# Patient Record
Sex: Female | Born: 1965 | Race: White | Hispanic: No | Marital: Married | Smoking: Current every day smoker
Health system: Southern US, Community
[De-identification: ages and names within clinical notes are randomized; demographics above are authoritative.]

## PROBLEM LIST (undated history)

## (undated) DIAGNOSIS — F32A Depression, unspecified: Secondary | ICD-10-CM

## (undated) DIAGNOSIS — E119 Type 2 diabetes mellitus without complications: Secondary | ICD-10-CM

## (undated) DIAGNOSIS — I219 Acute myocardial infarction, unspecified: Secondary | ICD-10-CM

## (undated) DIAGNOSIS — I509 Heart failure, unspecified: Secondary | ICD-10-CM

## (undated) HISTORY — PX: CARDIAC CATHETERIZATION: SHX172

## (undated) HISTORY — PX: OTHER SURGICAL HISTORY: SHX169

---

## 2004-06-15 ENCOUNTER — Emergency Department: Payer: Self-pay | Admitting: Emergency Medicine

## 2006-04-27 ENCOUNTER — Emergency Department: Payer: Self-pay | Admitting: Emergency Medicine

## 2009-04-01 ENCOUNTER — Emergency Department: Payer: Self-pay

## 2011-01-10 ENCOUNTER — Ambulatory Visit: Payer: Self-pay | Admitting: Orthopedic Surgery

## 2011-10-27 ENCOUNTER — Emergency Department: Payer: Self-pay | Admitting: Emergency Medicine

## 2011-10-27 LAB — COMPREHENSIVE METABOLIC PANEL
Albumin: 3.6 g/dL (ref 3.4–5.0)
Alkaline Phosphatase: 116 U/L (ref 50–136)
BUN: 7 mg/dL (ref 7–18)
Bilirubin,Total: 0.3 mg/dL (ref 0.2–1.0)
Chloride: 106 mmol/L (ref 98–107)
Creatinine: 0.49 mg/dL — ABNORMAL LOW (ref 0.60–1.30)
EGFR (African American): >60
EGFR (Non-African Amer.): >60
Osmolality: 277 (ref 275–301)
SGOT(AST): 11 U/L — ABNORMAL LOW (ref 15–37)
SGPT (ALT): 17 U/L (ref 12–78)
Sodium: 140 mmol/L (ref 136–145)
Total Protein: 7.2 g/dL (ref 6.4–8.2)

## 2011-10-27 LAB — CBC
HCT: 33.1 % — ABNORMAL LOW (ref 35.0–47.0)
MCV: 84 fL (ref 80–100)
Platelet: 355 10*3/uL (ref 150–440)
RBC: 3.93 10*6/uL (ref 3.80–5.20)
RDW: 21.1 % — ABNORMAL HIGH (ref 11.5–14.5)
WBC: 8.9 10*3/uL (ref 3.6–11.0)

## 2011-10-27 LAB — URINALYSIS, COMPLETE
Bilirubin,UR: NEGATIVE
Ketone: NEGATIVE
RBC,UR: NONE SEEN /HPF (ref 0–5)
Squamous Epithelial: <1
WBC UR: <1 /HPF (ref 0–5)

## 2011-10-27 LAB — LIPASE, BLOOD: Lipase: 308 U/L (ref 73–393)

## 2011-12-02 ENCOUNTER — Ambulatory Visit: Payer: Self-pay | Admitting: Family Medicine

## 2011-12-16 ENCOUNTER — Ambulatory Visit: Payer: Self-pay | Admitting: Family Medicine

## 2013-04-12 ENCOUNTER — Emergency Department: Payer: Self-pay | Admitting: Emergency Medicine

## 2013-04-12 LAB — CBC WITH DIFFERENTIAL/PLATELET
BASOS ABS: 0.1 10*3/uL (ref 0.0–0.1)
Basophil %: 0.9 %
Eosinophil #: 0.1 10*3/uL (ref 0.0–0.7)
Eosinophil %: 0.5 %
HCT: 37.3 % (ref 35.0–47.0)
HGB: 11.6 g/dL — ABNORMAL LOW (ref 12.0–16.0)
Lymphocyte #: 4.7 10*3/uL — ABNORMAL HIGH (ref 1.0–3.6)
Lymphocyte %: 30.6 %
MCH: 23.8 pg — ABNORMAL LOW (ref 26.0–34.0)
MCHC: 31.1 g/dL — AB (ref 32.0–36.0)
MCV: 77 fL — AB (ref 80–100)
MONOS PCT: 10.1 %
Monocyte #: 1.5 x10 3/mm — ABNORMAL HIGH (ref 0.2–0.9)
NEUTROS ABS: 8.9 10*3/uL — AB (ref 1.4–6.5)
NEUTROS PCT: 57.9 %
PLATELETS: 427 10*3/uL (ref 150–440)
RBC: 4.85 10*6/uL (ref 3.80–5.20)
RDW: 19.8 % — ABNORMAL HIGH (ref 11.5–14.5)
WBC: 15.3 10*3/uL — ABNORMAL HIGH (ref 3.6–11.0)

## 2013-04-12 LAB — URINALYSIS, COMPLETE
BLOOD: NEGATIVE
Bacteria: NONE SEEN
Bilirubin,UR: NEGATIVE
Glucose,UR: NEGATIVE mg/dL (ref 0–75)
Ketone: NEGATIVE
LEUKOCYTE ESTERASE: NEGATIVE
NITRITE: NEGATIVE
PH: 5 (ref 4.5–8.0)
PROTEIN: NEGATIVE
RBC,UR: NONE SEEN /HPF (ref 0–5)
Specific Gravity: 1.005 (ref 1.003–1.030)
Squamous Epithelial: 1

## 2013-04-12 LAB — COMPREHENSIVE METABOLIC PANEL
ANION GAP: 8 (ref 7–16)
Albumin: 4 g/dL (ref 3.4–5.0)
Alkaline Phosphatase: 150 U/L — ABNORMAL HIGH
BUN: 12 mg/dL (ref 7–18)
Bilirubin,Total: 0.6 mg/dL (ref 0.2–1.0)
CALCIUM: 9.4 mg/dL (ref 8.5–10.1)
CHLORIDE: 100 mmol/L (ref 98–107)
Co2: 24 mmol/L (ref 21–32)
Creatinine: 1.13 mg/dL (ref 0.60–1.30)
EGFR (African American): >60
EGFR (Non-African Amer.): 57 — ABNORMAL LOW
Glucose: 149 mg/dL — ABNORMAL HIGH (ref 65–99)
OSMOLALITY: 267 (ref 275–301)
Potassium: 3.7 mmol/L (ref 3.5–5.1)
SGOT(AST): 20 U/L (ref 15–37)
SGPT (ALT): 21 U/L (ref 12–78)
Sodium: 132 mmol/L — ABNORMAL LOW (ref 136–145)
TOTAL PROTEIN: 7.9 g/dL (ref 6.4–8.2)

## 2013-04-12 LAB — LIPASE, BLOOD: Lipase: 216 U/L (ref 73–393)

## 2013-09-24 ENCOUNTER — Emergency Department: Payer: Self-pay | Admitting: Emergency Medicine

## 2013-09-24 LAB — ACETAMINOPHEN LEVEL

## 2013-09-24 LAB — COMPREHENSIVE METABOLIC PANEL
ALBUMIN: 3.3 g/dL — AB (ref 3.4–5.0)
ALT: 18 U/L
ANION GAP: 7 (ref 7–16)
Alkaline Phosphatase: 134 U/L — ABNORMAL HIGH
BILIRUBIN TOTAL: 0.2 mg/dL (ref 0.2–1.0)
BUN: 7 mg/dL (ref 7–18)
CALCIUM: 8.6 mg/dL (ref 8.5–10.1)
CREATININE: 0.57 mg/dL — AB (ref 0.60–1.30)
Chloride: 107 mmol/L (ref 98–107)
Co2: 24 mmol/L (ref 21–32)
EGFR (Non-African Amer.): >60
Glucose: 159 mg/dL — ABNORMAL HIGH (ref 65–99)
Osmolality: 277 (ref 275–301)
Potassium: 3.8 mmol/L (ref 3.5–5.1)
SGOT(AST): 20 U/L (ref 15–37)
Sodium: 138 mmol/L (ref 136–145)
TOTAL PROTEIN: 7.5 g/dL (ref 6.4–8.2)

## 2013-09-24 LAB — DRUG SCREEN, URINE
AMPHETAMINES, UR SCREEN: NEGATIVE (ref ?–1000)
BARBITURATES, UR SCREEN: NEGATIVE (ref ?–200)
Benzodiazepine, Ur Scrn: POSITIVE (ref ?–200)
COCAINE METABOLITE, UR ~~LOC~~: NEGATIVE (ref ?–300)
Cannabinoid 50 Ng, Ur ~~LOC~~: POSITIVE (ref ?–50)
MDMA (ECSTASY) UR SCREEN: NEGATIVE (ref ?–500)
Methadone, Ur Screen: NEGATIVE (ref ?–300)
Opiate, Ur Screen: NEGATIVE (ref ?–300)
Phencyclidine (PCP) Ur S: NEGATIVE (ref ?–25)
TRICYCLIC, UR SCREEN: NEGATIVE (ref ?–1000)

## 2013-09-24 LAB — URINALYSIS, COMPLETE
Bacteria: NONE SEEN
Bilirubin,UR: NEGATIVE
Glucose,UR: 150 mg/dL (ref 0–75)
Ketone: NEGATIVE
Leukocyte Esterase: NEGATIVE
Nitrite: NEGATIVE
Ph: 6 (ref 4.5–8.0)
Protein: NEGATIVE
Specific Gravity: 1.002 (ref 1.003–1.030)
Squamous Epithelial: 2

## 2013-09-24 LAB — CBC
HCT: 34.1 % — ABNORMAL LOW (ref 35.0–47.0)
HGB: 10.3 g/dL — ABNORMAL LOW (ref 12.0–16.0)
MCH: 22.9 pg — ABNORMAL LOW (ref 26.0–34.0)
MCHC: 30.2 g/dL — ABNORMAL LOW (ref 32.0–36.0)
MCV: 76 fL — AB (ref 80–100)
Platelet: 332 10*3/uL (ref 150–440)
RBC: 4.49 10*6/uL (ref 3.80–5.20)
RDW: 21.4 % — ABNORMAL HIGH (ref 11.5–14.5)
WBC: 11.1 10*3/uL — AB (ref 3.6–11.0)

## 2013-09-24 LAB — TSH: THYROID STIMULATING HORM: 1.79 u[IU]/mL

## 2013-09-24 LAB — ETHANOL: Ethanol: <3 mg/dL

## 2013-09-24 LAB — SALICYLATE LEVEL: Salicylates, Serum: 2.6 mg/dL

## 2014-04-29 NOTE — Consult Note (Signed)
PATIENT NAME:  Tina Cisneros, Tina Cisneros MR#:  [id number] DATE OF BIRTH:  02-Sep-1965  DATE OF CONSULTATION:  09/25/2013  REFERRING PHYSICIAN:   CONSULTING PHYSICIAN:  Kaiyden Simkin K. Mina Babula, MD  SUBJECTIVE:  The patient was seen in consultation in the [facility] Emergency Room, [facility].  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  The patient is married for 20 years and lives with her husband, who is a Nutritional therapistplumber.  The patient and husband live along with their disabled daughter who is 8018 year's old and has mild mental disabilities and a 51 year old daughter who could not get along with her husband and moved back with them along with their 51-year-old child.  All five of them live together.  The patient was brought to [facility] after she had taken too many Xanax pills, and she fell down.  According to information obtained from the chart, the patient presented to the Emergency Room due to taking an unknown amount of Xanax.  She fell and hit her head on a doorknob at her home and was brought to the Emergency Room to get checked out.  According to Emergency Room reports, the patient was voicing suicidal ideas, and she told her family that they would be better off without her.  She again stated that she was not suicidal and what she said about her family being better off without her was a misunderstanding.   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  She had an appointment with [facility], and they are going to place her in a group home and she filled out all the papers.  She bought Xanax pills, a couple of them from the streets, and she took them and then she hit her head on the doorknob.  After she came to the Emergency Room, multiple stitches  were put in her left eyebrow.  PAST PSYCHIATRIC HISTORY:  No previous history of inpatient on psychiatry.  No history of suicidal attempts.  Not been followed by any psychiatrist except that she took her daughter to [facility] for help.  ALCOHOL AND DRUGS:  Denied drinking alcohol.   Denies street or prescription drug abuse.  Denies smoking nicotine cigarettes.   MENTAL STATUS: The patient is dressed in hospital clothes, alert and oriented to place, person, and time.  Pleasant and cooperative.  Appears really stressed out as stated above  when her husband was waiting in the Emergency Room [facility], he had a myocardial infarction and was taken to [facility] and he is an inpatient there now.  Admits feeling stressed out.  She realizes that her family needs her really badly.  No psychosis.  Does not appear to be responding to internal stimuli.  Cognition intact.  General information fair.  Memory is intact.  Could recall all the memory.  Denies any suicidal or homicidal plans.  Admits that she needs to live to take care of her family and is eager to go home to help her family but does feel stressed out and she realizes that she needs help with the same.  Insight and judgment fair.  Impulse control is fair and improved.    IMPRESSION: Acute stress disorder, major depression - moderate.  PLAN:  Discontinue involuntary commitment.  Discharge the patient home and she will go to [facility] for help along with her daughter, and she realizes that she needs to be involved in group and individual therapy to learn better coping skills and not decompensate and get Xanax off the streets.     ____________________________ Jannet Mantis. Guss Bunde, MD skc:nr D: 09/25/2013 11:43:12 ET T: 09/25/2013 12:51:34 ET JOB#: [id number]  cc: Monika Salk K. Guss Bunde, MD, <Dictator> Beau Fanny MD ELECTRONICALLY SIGNED 10/01/2013 10:50

## 2014-10-06 ENCOUNTER — Encounter: Payer: Self-pay | Admitting: Emergency Medicine

## 2014-10-06 ENCOUNTER — Emergency Department
Admission: EM | Admit: 2014-10-06 | Discharge: 2014-10-06 | Disposition: A | Discharge status: Home / Self Care | Payer: Self-pay | Attending: Emergency Medicine | Admitting: Emergency Medicine

## 2014-10-06 ENCOUNTER — Other Ambulatory Visit: Payer: Self-pay | Admitting: Nurse Practitioner

## 2014-10-06 DIAGNOSIS — Y9389 Activity, other specified: Secondary | ICD-10-CM | POA: Insufficient documentation

## 2014-10-06 DIAGNOSIS — S4990XA Unspecified injury of shoulder and upper arm, unspecified arm, initial encounter: Secondary | ICD-10-CM | POA: Insufficient documentation

## 2014-10-06 DIAGNOSIS — Z720 Tobacco use: Secondary | ICD-10-CM | POA: Insufficient documentation

## 2014-10-06 DIAGNOSIS — M7918 Myalgia, other site: Secondary | ICD-10-CM

## 2014-10-06 DIAGNOSIS — S199XXA Unspecified injury of neck, initial encounter: Secondary | ICD-10-CM | POA: Insufficient documentation

## 2014-10-06 DIAGNOSIS — Y998 Other external cause status: Secondary | ICD-10-CM | POA: Insufficient documentation

## 2014-10-06 DIAGNOSIS — S3992XA Unspecified injury of lower back, initial encounter: Secondary | ICD-10-CM | POA: Insufficient documentation

## 2014-10-06 DIAGNOSIS — Z1231 Encounter for screening mammogram for malignant neoplasm of breast: Secondary | ICD-10-CM

## 2014-10-06 DIAGNOSIS — Y92410 Unspecified street and highway as the place of occurrence of the external cause: Secondary | ICD-10-CM | POA: Insufficient documentation

## 2014-10-06 DIAGNOSIS — S299XXA Unspecified injury of thorax, initial encounter: Secondary | ICD-10-CM | POA: Insufficient documentation

## 2014-10-06 MED ORDER — DIAZEPAM 5 MG/ML IJ SOLN
5.0000 mg | Freq: Once | INTRAMUSCULAR | Status: AC
  Administered 2014-10-06: 5 mg via INTRAVENOUS
  Filled 2014-10-06: qty 2

## 2014-10-06 MED ORDER — KETOROLAC TROMETHAMINE 30 MG/ML IJ SOLN
30.0000 mg | Freq: Once | INTRAMUSCULAR | Status: AC
  Administered 2014-10-06: 30 mg via INTRAVENOUS
  Filled 2014-10-06: qty 1

## 2014-10-06 MED ORDER — IBUPROFEN 800 MG PO TABS: 800.0000 mg | ORAL_TABLET | Freq: Three times a day (TID) | ORAL | Status: DC | PRN

## 2014-10-06 MED ORDER — OXYCODONE-ACETAMINOPHEN 5-325 MG PO TABS: 1.0000 | ORAL_TABLET | ORAL | Status: DC | PRN

## 2014-10-06 MED ORDER — CYCLOBENZAPRINE HCL 10 MG PO TABS: 10.0000 mg | ORAL_TABLET | Freq: Three times a day (TID) | ORAL | Status: DC | PRN
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
## 2014-10-06 NOTE — ED Notes (Signed)
Pt in [facility] yesterday and now having back pain and muscle spasms.

## 2014-10-19 ENCOUNTER — Ambulatory Visit
Admission: RE | Admit: 2014-10-19 | Discharge: 2014-10-19 | Disposition: A | Discharge status: Home / Self Care | Payer: No Typology Code available for payment source | Source: Ambulatory Visit | Attending: Nurse Practitioner | Admitting: Nurse Practitioner

## 2014-10-19 DIAGNOSIS — Z1231 Encounter for screening mammogram for malignant neoplasm of breast: Secondary | ICD-10-CM

## 2015-01-18 ENCOUNTER — Emergency Department
Admission: EM | Admit: 2015-01-18 | Discharge: 2015-01-18 | Disposition: A | Discharge status: Home / Self Care | Payer: Self-pay | Attending: Emergency Medicine | Admitting: Emergency Medicine

## 2015-01-18 DIAGNOSIS — M62830 Muscle spasm of back: Secondary | ICD-10-CM | POA: Insufficient documentation

## 2015-01-18 DIAGNOSIS — W010XXA Fall on same level from slipping, tripping and stumbling without subsequent striking against object, initial encounter: Secondary | ICD-10-CM | POA: Insufficient documentation

## 2015-01-18 DIAGNOSIS — Y998 Other external cause status: Secondary | ICD-10-CM | POA: Insufficient documentation

## 2015-01-18 DIAGNOSIS — Y9389 Activity, other specified: Secondary | ICD-10-CM | POA: Insufficient documentation

## 2015-01-18 DIAGNOSIS — W19XXXA Unspecified fall, initial encounter: Secondary | ICD-10-CM

## 2015-01-18 DIAGNOSIS — Y9289 Other specified places as the place of occurrence of the external cause: Secondary | ICD-10-CM | POA: Insufficient documentation

## 2015-01-18 DIAGNOSIS — S40011A Contusion of right shoulder, initial encounter: Secondary | ICD-10-CM | POA: Insufficient documentation

## 2015-01-18 DIAGNOSIS — S3992XA Unspecified injury of lower back, initial encounter: Secondary | ICD-10-CM | POA: Insufficient documentation

## 2015-01-18 DIAGNOSIS — S93401A Sprain of unspecified ligament of right ankle, initial encounter: Secondary | ICD-10-CM | POA: Insufficient documentation

## 2015-01-18 DIAGNOSIS — F17200 Nicotine dependence, unspecified, uncomplicated: Secondary | ICD-10-CM | POA: Insufficient documentation

## 2015-01-18 DIAGNOSIS — S5001XA Contusion of right elbow, initial encounter: Secondary | ICD-10-CM | POA: Insufficient documentation

## 2015-01-18 MED ORDER — MELOXICAM 15 MG PO TABS: 15.0000 mg | ORAL_TABLET | Freq: Every day | ORAL | Status: DC

## 2015-01-18 MED ORDER — OXYCODONE-ACETAMINOPHEN 5-325 MG PO TABS
1.0000 | ORAL_TABLET | Freq: Once | ORAL | Status: AC
  Administered 2015-01-18: 1 via ORAL
  Filled 2015-01-18: qty 1

## 2015-01-18 MED ORDER — HYDROCODONE-ACETAMINOPHEN 5-325 MG PO TABS: 1.0000 | ORAL_TABLET | ORAL | Status: DC | PRN

## 2015-01-18 MED ORDER — METHOCARBAMOL 500 MG PO TABS: 500.0000 mg | ORAL_TABLET | Freq: Four times a day (QID) | ORAL | Status: DC

## 2015-01-18 NOTE — ED Provider Notes (Signed)
[facility] Emergency Department Provider Note  ____________________________________________  Time seen: Approximately 2:24 PM  I have reviewed the triage vital signs and the nursing notes.   HISTORY  Chief Complaint Fall    HPI Tina Cisneros is a 50 y.o. female who presents emergency department status post a fall last night. She states that she was on her front porch when she accidentally stepped off the side of the porch landing on her right ankle, right elbow, right shoulder, and twisting her back. She tried to go to work this morning states that area started taking so she percent emergency department. Patient is ambulatory on affected ankle. She has full range of motion to shoulder and elbow on right extremity. She denies any numbness or tingling or saddle anesthesia. Patient did not hit her head or lose consciousness.   History reviewed. No pertinent past medical history.  There are no active problems to display for this patient.   History reviewed. No pertinent past surgical history.  Current Outpatient Rx  Name  Route  Sig  Dispense  Refill  . cyclobenzaprine (FLEXERIL) 10 MG tablet   Oral   Take 1 tablet (10 mg total) by mouth every 8 (eight) hours as needed for muscle spasms.   30 tablet   1   . HYDROcodone-acetaminophen (NORCO/VICODIN) 5-325 MG tablet   Oral   Take 1 tablet by mouth every 4 (four) hours as needed for moderate pain.   20 tablet   0   . ibuprofen (ADVIL,MOTRIN) 800 MG tablet   Oral   Take 1 tablet (800 mg total) by mouth every 8 (eight) hours as needed.   30 tablet   0   . meloxicam (MOBIC) 15 MG tablet   Oral   Take 1 tablet (15 mg total) by mouth daily.   30 tablet   0   . methocarbamol (ROBAXIN) 500 MG tablet   Oral   Take 1 tablet (500 mg total) by mouth 4 (four) times daily.   16 tablet   0   . oxyCODONE-acetaminophen (ROXICET) 5-325 MG tablet   Oral   Take 1-2 tablets by mouth every 4 (four) hours as  needed for severe pain.   15 tablet   0     Allergies Review of patient's allergies indicates no known allergies.  Family History  Problem Relation Age of Onset  . Breast cancer Sister     Social History Social History  Substance Use Topics  . Smoking status: Current Some Day Smoker  . Smokeless tobacco: None  . Alcohol Use: No     Review of Systems  Constitutional: No fever/chills Eyes: No visual changes. No discharge ENT: No sore throat. Cardiovascular: no chest pain. Respiratory: no cough. No SOB. Gastrointestinal: No abdominal pain.  No nausea, no vomiting.  No diarrhea.  No constipation. Genitourinary: Negative for dysuria. No hematuria Musculoskeletal: Positive for back pain. Positive for right shoulder, right elbow, right ankle pain. Skin: Negative for rash. Neurological: Negative for headaches, focal weakness or numbness. 10-point ROS otherwise negative.  ____________________________________________   PHYSICAL EXAM:  VITAL SIGNS: ED Triage Vitals  Enc Vitals Group     BP 01/18/15 1202 132/72 mmHg     Pulse Rate 01/18/15 1202 98     Resp 01/18/15 1202 18     Temp 01/18/15 1202 98.3 F (36.8 C)     Temp Source 01/18/15 1202 Oral     SpO2 01/18/15 1202 99 %     Weight  01/18/15 1202 152 lb (68.947 kg)     Height 01/18/15 1202 5\' 1"  (1.549 m)     Head Cir --      Peak Flow --      Pain Score 01/18/15 1203 7     Pain Loc --      Pain Edu? --      Excl. in GC? --      Constitutional: Alert and oriented. Well appearing and in no acute distress. Eyes: Conjunctivae are normal. PERRL. EOMI. Head: Atraumatic. ENT:      Ears:       Nose: No congestion/rhinnorhea.      Mouth/Throat: Mucous membranes are moist.  Neck: No stridor.  No cervical spine tenderness to palpation. Hematological/Lymphatic/Immunilogical: No cervical lymphadenopathy. Cardiovascular: Normal rate, regular rhythm. Normal S1 and S2.  Good peripheral circulation. Respiratory: Normal  respiratory effort without tachypnea or retractions. Lungs CTAB. Gastrointestinal: Soft and nontender. No distention. No CVA tenderness. Musculoskeletal: No lower extremity tenderness nor edema.  No joint effusions. No visible deformity to spine upon inspection. Patient is nontender to palpation. Midline. Patient is diffusely tender to palpation over the right-sided paraspinal muscle groups. Minor spasms are noted to palpation. No palpable abnormality. No visible abnormality to right shoulder or right elbow. Inspection. Full range of motion to both. No point tenderness to palpation. No palpable abnormality. Radial pulse and sensation are intact distally. Minor edema noted to the lateral malleolus of the right ankle compared to left. No visible deformity. Full range of motion to ankle. Patient is mildly tender to palpation along the talonavicular joint line. No tenderness to palpation over the tarsal or metatarsal bones. Dorsalis pedis pulses intact. Sensation is intact all 5 digits and equal without affected extremity. Neurologic:  Normal speech and language. No gross focal neurologic deficits are appreciated.  Skin:  Skin is warm, dry and intact. No rash noted. Psychiatric: Mood and affect are normal. Speech and behavior are normal. Patient exhibits appropriate insight and judgement.   ____________________________________________   LABS (all labs ordered are listed, but only abnormal results are displayed)  Labs Reviewed - No data to display ____________________________________________  EKG   ____________________________________________  RADIOLOGY   No results found.  ____________________________________________    PROCEDURES  Procedure(s) performed:       Medications  oxyCODONE-acetaminophen (PERCOCET/ROXICET) 5-325 MG per tablet 1 tablet (1 tablet Oral Given 01/18/15 1411)     ____________________________________________   INITIAL IMPRESSION / ASSESSMENT AND PLAN / ED  COURSE  Pertinent labs & imaging results that were available during my care of the patient were reviewed by me and considered in my medical decision making (see chart for details).  Patient's diagnosis is consistent with a fall causing contusions to the right shoulder, right elbow, and it right ankle sprain. Patient also has paraspinal muscle spasms and the right lumbar region. Patient will be discharged home with prescriptions for anti-inflammatories, muscle relaxers, pain medication. Patient is to follow up with primary care if symptoms persist past this treatment course. Patient is given ED precautions to return to the ED for any worsening or new symptoms.     ____________________________________________  FINAL CLINICAL IMPRESSION(S) / ED DIAGNOSES  Final diagnoses:  Fall, initial encounter  Shoulder contusion, right, initial encounter  Elbow contusion, right, initial encounter  Lumbar paraspinal muscle spasm  Ankle sprain, right, initial encounter      NEW MEDICATIONS STARTED DURING THIS VISIT:  New Prescriptions   HYDROCODONE-ACETAMINOPHEN (NORCO/VICODIN) 5-325 MG TABLET    Take  1 tablet by mouth every 4 (four) hours as needed for moderate pain.   MELOXICAM (MOBIC) 15 MG TABLET    Take 1 tablet (15 mg total) by mouth daily.   METHOCARBAMOL (ROBAXIN) 500 MG TABLET    Take 1 tablet (500 mg total) by mouth 4 (four) times daily.        Delorise Royals Allean Montfort, PA-C 01/18/15 1510  Sharman Cheek, MD 01/18/15 1537

## 2015-01-18 NOTE — ED Notes (Signed)
Assess per PA 

## 2015-01-18 NOTE — Discharge Instructions (Signed)
Ankle Sprain °An ankle sprain is an injury to the strong, fibrous tissues (ligaments) that hold the bones of your ankle joint together.  °CAUSES °An ankle sprain is usually caused by a fall or by twisting your ankle. Ankle sprains most commonly occur when you step on the outer edge of your foot, and your ankle turns inward. People who participate in sports are more prone to these types of injuries.  °SYMPTOMS  °· Pain in your ankle. The pain may be present at rest or only when you are trying to stand or walk. °· Swelling. °· Bruising. Bruising may develop immediately or within 1 to 2 days after your injury. °· Difficulty standing or walking, particularly when turning corners or changing directions. °DIAGNOSIS  °Your caregiver will ask you details about your injury and perform a physical exam of your ankle to determine if you have an ankle sprain. During the physical exam, your caregiver will press on and apply pressure to specific areas of your foot and ankle. Your caregiver will try to move your ankle in certain ways. An X-ray exam may be done to be sure a bone was not broken or a ligament did not separate from one of the bones in your ankle (avulsion fracture).  °TREATMENT  °Certain types of braces can help stabilize your ankle. Your caregiver can make a recommendation for this. Your caregiver may recommend the use of medicine for pain. If your sprain is severe, your caregiver may refer you to a surgeon who helps to restore function to parts of your skeletal system (orthopedist) or a physical therapist. °HOME CARE INSTRUCTIONS  °· Apply ice to your injury for 1-2 days or as directed by your caregiver. Applying ice helps to reduce inflammation and pain. °· Put ice in a plastic bag. °· Place a towel between your skin and the bag. °· Leave the ice on for 15-20 minutes at a time, every 2 hours while you are awake. °· Only take over-the-counter or prescription medicines for pain, discomfort, or fever as directed by  your caregiver. °· Elevate your injured ankle above the level of your heart as much as possible for 2-3 days. °· If your caregiver recommends crutches, use them as instructed. Gradually put weight on the affected ankle. Continue to use crutches or a cane until you can walk without feeling pain in your ankle. °· If you have a plaster splint, wear the splint as directed by your caregiver. Do not rest it on anything harder than a pillow for the first 24 hours. Do not put weight on it. Do not get it wet. You may take it off to take a shower or bath. °· You may have been given an elastic bandage to wear around your ankle to provide support. If the elastic bandage is too tight (you have numbness or tingling in your foot or your foot becomes cold and blue), adjust the bandage to make it comfortable. °· If you have an air splint, you may blow more air into it or let air out to make it more comfortable. You may take your splint off at night and before taking a shower or bath. Wiggle your toes in the splint several times per day to decrease swelling. °SEEK MEDICAL CARE IF:  °· You have rapidly increasing bruising or swelling. °· Your toes feel extremely cold or you lose feeling in your foot. °· Your pain is not relieved with medicine. °SEEK IMMEDIATE MEDICAL CARE IF: °· Your toes are numb or blue. °·   You have severe pain that is increasing. MAKE SURE YOU:   Understand these instructions.  Will watch your condition.  Will get help right away if you are not doing well or get worse.   This information is not intended to replace advice given to you by your health care provider. Make sure you discuss any questions you have with your health care provider.   Document Released: 12/23/2004 Document Revised: 01/13/2014 Document Reviewed: 01/04/2011 Elsevier Interactive Patient Education 2016 [employer]  Contusion A contusion is a deep bruise. Contusions are the result of a blunt injury to tissues and muscle fibers  under the skin. The injury causes bleeding under the skin. The skin overlying the contusion may turn blue, purple, or yellow. Minor injuries will give you a painless contusion, but more severe contusions may stay painful and swollen for a few weeks.  CAUSES  This condition is usually caused by a blow, trauma, or direct force to an area of the body. SYMPTOMS  Symptoms of this condition include:  Swelling of the injured area.  Pain and tenderness in the injured area.  Discoloration. The area may have redness and then turn blue, purple, or yellow. DIAGNOSIS  This condition is diagnosed based on a physical exam and medical history. An X-ray, CT scan, or MRI may be needed to determine if there are any associated injuries, such as broken bones (fractures). TREATMENT  Specific treatment for this condition depends on what area of the body was injured. In general, the best treatment for a contusion is resting, icing, applying pressure to (compression), and elevating the injured area. This is often called the RICE strategy. Over-the-counter anti-inflammatory medicines may also be recommended for pain control.  HOME CARE INSTRUCTIONS   Rest the injured area.  If directed, apply ice to the injured area:  Put ice in a plastic bag.  Place a towel between your skin and the bag.  Leave the ice on for 20 minutes, 2-3 times per day.  If directed, apply light compression to the injured area using an elastic bandage. Make sure the bandage is not wrapped too tightly. Remove and reapply the bandage as directed by your health care provider.  If possible, raise (elevate) the injured area above the level of your heart while you are sitting or lying down.  Take over-the-counter and prescription medicines only as told by your health care provider. SEEK MEDICAL CARE IF:  Your symptoms do not improve after several days of treatment.  Your symptoms get worse.  You have difficulty moving the injured  area. SEEK IMMEDIATE MEDICAL CARE IF:   You have severe pain.  You have numbness in a hand or foot.  Your hand or foot turns pale or cold.   This information is not intended to replace advice given to you by your health care provider. Make sure you discuss any questions you have with your health care provider.   Document Released: 10/02/2004 Document Revised: 09/13/2014 Document Reviewed: 05/10/2014 Elsevier Interactive Patient Education 2016 [employer]  Cryotherapy Cryotherapy means treatment with cold. Ice or gel packs can be used to reduce both pain and swelling. Ice is the most helpful within the first 24 to 48 hours after an injury or flare-up from overusing a muscle or joint. Sprains, strains, spasms, burning pain, shooting pain, and aches can all be eased with ice. Ice can also be used when recovering from surgery. Ice is effective, has very few side effects, and is safe for most people to  use. PRECAUTIONS  Ice is not a safe treatment option for people with:  Raynaud phenomenon. This is a condition affecting small blood vessels in the extremities. Exposure to cold may cause your problems to return.  Cold hypersensitivity. There are many forms of cold hypersensitivity, including:  Cold urticaria. Red, itchy hives appear on the skin when the tissues begin to warm after being iced.  Cold erythema. This is a red, itchy rash caused by exposure to cold.  Cold hemoglobinuria. Red blood cells break down when the tissues begin to warm after being iced. The hemoglobin that carry oxygen are passed into the urine because they cannot combine with blood proteins fast enough.  Numbness or altered sensitivity in the area being iced. If you have any of the following conditions, do not use ice until you have discussed cryotherapy with your caregiver:  Heart conditions, such as arrhythmia, angina, or chronic heart disease.  High blood pressure.  Healing wounds or open skin in the area  being iced.  Current infections.  Rheumatoid arthritis.  Poor circulation.  Diabetes. Ice slows the blood flow in the region it is applied. This is beneficial when trying to stop inflamed tissues from spreading irritating chemicals to surrounding tissues. However, if you expose your skin to cold temperatures for too long or without the proper protection, you can damage your skin or nerves. Watch for signs of skin damage due to cold. HOME CARE INSTRUCTIONS Follow these tips to use ice and cold packs safely.  Place a dry or damp towel between the ice and skin. A damp towel will cool the skin more quickly, so you may need to shorten the time that the ice is used.  For a more rapid response, add gentle compression to the ice.  Ice for no more than 10 to 20 minutes at a time. The bonier the area you are icing, the less time it will take to get the benefits of ice.  Check your skin after 5 minutes to make sure there are no signs of a poor response to cold or skin damage.  Rest 20 minutes or more between uses.  Once your skin is numb, you can end your treatment. You can test numbness by very lightly touching your skin. The touch should be so light that you do not see the skin dimple from the pressure of your fingertip. When using ice, most people will feel these normal sensations in this order: cold, burning, aching, and numbness.  Do not use ice on someone who cannot communicate their responses to pain, such as small children or people with dementia. HOW TO MAKE AN ICE PACK Ice packs are the most common way to use ice therapy. Other methods include ice massage, ice baths, and cryosprays. Muscle creams that cause a cold, tingly feeling do not offer the same benefits that ice offers and should not be used as a substitute unless recommended by your caregiver. To make an ice pack, do one of the following:  Place crushed ice or a bag of frozen vegetables in a sealable plastic bag. Squeeze out the  excess air. Place this bag inside another plastic bag. Slide the bag into a pillowcase or place a damp towel between your skin and the bag.  Mix 3 parts water with 1 part rubbing alcohol. Freeze the mixture in a sealable plastic bag. When you remove the mixture from the freezer, it will be slushy. Squeeze out the excess air. Place this bag inside another plastic bag.  Slide the bag into a pillowcase or place a damp towel between your skin and the bag. SEEK MEDICAL CARE IF:  You develop white spots on your skin. This may give the skin a blotchy (mottled) appearance.  Your skin turns blue or pale.  Your skin becomes waxy or hard.  Your swelling gets worse. MAKE SURE YOU:   Understand these instructions.  Will watch your condition.  Will get help right away if you are not doing well or get worse.   This information is not intended to replace advice given to you by your health care provider. Make sure you discuss any questions you have with your health care provider.   Stirrup Ankle Brace  Stirrup ankle braces give support and help stabilize the ankle joint. They are rigid pieces of plastic or fiberglass that go up both sides of the lower leg with the bottom of the stirrup fitting comfortably under the bottom of the instep of the foot. It can be held on with Velcro straps or an elastic wrap. Stirrup ankle braces are used to support the ankle following mild or moderate sprains or strains, or fractures after cast removal.  They can be easily removed or adjusted if there is swelling. The rigid brace shells are designed to fit the ankle comfortably and provide the needed medial/lateral stabilization. This brace can be easily worn with most athletic shoes. The brace liner is usually made of a soft, comfortable gel-like material. This gel fits the ankle well without causing uncomfortable pressure points.  IMPORTANCE OF ANKLE BRACES:  The use of ankle bracing is effective in the prevention of ankle  sprains.  In athletes, the use of ankle bracing will offer protection and prevent further sprains.  Research shows that a complete rehabilitation program needs to be included with external bracing. This includes range of motion and ankle strengthening exercises. Your caregivers will instruct you in this. If you were given the brace today for a new injury, use the following home care instructions as a guide.  HOME CARE INSTRUCTIONS  Apply ice to the sore area for 15-20 minutes, 03-04 times per day while awake for the first 2 days. Put the ice in a plastic bag and place a towel between the bag of ice and your skin. Never place the ice pack directly on your skin. Be especially careful using ice on an elbow or knee or other bony area, such as your ankle, because icing for too long may damage the nerves which are close to the surface.  Keep your leg elevated when possible to lessen swelling.  Wear your splint until you are seen for a follow-up examination. Do not put weight on it. Do not get it wet. You may take it off to take a shower or bath.  For Activity: Use crutches with non-weight bearing for 1 week. Then, you may walk on your ankle as instructed. Start gradually with weight bearing on the affected ankle.  Continue to use crutches or a cane until you can stand on your ankle without causing pain.  Wiggle your toes in the splint several times per day if you are able.  The splint is too tight if you have numbness, tingling, or if your foot becomes cold and blue. Adjust the straps or elastic bandage to make it comfortable.  Only take over-the-counter or prescription medicines for pain, discomfort, or fever as directed by your caregiver. SEEK IMMEDIATE MEDICAL CARE IF:  You have increased bruising, swelling or pain.  Your toes are blue or cold and loosening the brace or wrap does not help.  Your pain is not relieved with medicine. MAKE SURE YOU:  Understand these instructions.  Will watch your  condition.  Will get help right away if you are not doing well or get worse. This information is not intended to replace advice given to you by your health care provider. Make sure you discuss any questions you have with your health care provider.  Document Released: 10/24/2003 Document Revised: 03/17/2011 Document Reviewed: 07/25/2014  Elsevier Interactive Patient Education 2016 [employer]     Document Released: 08/19/2010 Document Revised: 01/13/2014 Document Reviewed: 08/19/2010 Elsevier Interactive Patient Education [employer]

## 2015-01-18 NOTE — ED Notes (Signed)
Fall off of front porch onto concrete on right side of body. Pt denies LOC. Pt c/o pain to right arm, right shoulder and right side of back. Pt alert and oriented X4, active, cooperative, pt in NAD. RR even and unlabored, color WNL.  Abrasion to right arm.

## 2018-03-04 ENCOUNTER — Emergency Department
Admission: EM | Admit: 2018-03-04 | Discharge: 2018-03-04 | Disposition: A | Discharge status: Home / Self Care | Payer: BLUE CROSS/BLUE SHIELD | Attending: Student in an Organized Health Care Education/Training Program | Admitting: Student in an Organized Health Care Education/Training Program

## 2018-03-04 ENCOUNTER — Encounter: Payer: Self-pay | Admitting: Emergency Medicine

## 2018-03-04 ENCOUNTER — Emergency Department: Payer: BLUE CROSS/BLUE SHIELD

## 2018-03-04 ENCOUNTER — Other Ambulatory Visit: Payer: Self-pay

## 2018-03-04 DIAGNOSIS — W108XXA Fall (on) (from) other stairs and steps, initial encounter: Secondary | ICD-10-CM | POA: Insufficient documentation

## 2018-03-04 DIAGNOSIS — F17210 Nicotine dependence, cigarettes, uncomplicated: Secondary | ICD-10-CM | POA: Insufficient documentation

## 2018-03-04 DIAGNOSIS — Y9389 Activity, other specified: Secondary | ICD-10-CM | POA: Insufficient documentation

## 2018-03-04 DIAGNOSIS — Y929 Unspecified place or not applicable: Secondary | ICD-10-CM | POA: Insufficient documentation

## 2018-03-04 DIAGNOSIS — Y999 Unspecified external cause status: Secondary | ICD-10-CM | POA: Insufficient documentation

## 2018-03-04 DIAGNOSIS — Z79899 Other long term (current) drug therapy: Secondary | ICD-10-CM | POA: Diagnosis not present

## 2018-03-04 DIAGNOSIS — S2242XA Multiple fractures of ribs, left side, initial encounter for closed fracture: Secondary | ICD-10-CM

## 2018-03-04 DIAGNOSIS — S299XXA Unspecified injury of thorax, initial encounter: Secondary | ICD-10-CM | POA: Diagnosis present

## 2018-03-04 MED ORDER — LIDOCAINE 5 % EX PTCH: 1.0000 | MEDICATED_PATCH | Freq: Two times a day (BID) | CUTANEOUS | 0 refills | Status: AC

## 2018-03-04 MED ORDER — LIDOCAINE 5 % EX PTCH
1.0000 | MEDICATED_PATCH | CUTANEOUS | Status: DC
  Administered 2018-03-04: 1 via TRANSDERMAL
  Filled 2018-03-04: qty 1

## 2018-03-04 MED ORDER — OXYCODONE-ACETAMINOPHEN 5-325 MG PO TABS
1.0000 | ORAL_TABLET | Freq: Once | ORAL | Status: AC
  Administered 2018-03-04: 1 via ORAL
  Filled 2018-03-04: qty 1

## 2018-03-04 MED ORDER — AZITHROMYCIN 250 MG PO TABS: ORAL_TABLET | ORAL | 0 refills | Status: AC

## 2018-03-04 MED ORDER — OXYCODONE-ACETAMINOPHEN 7.5-325 MG PO TABS: 1.0000 | ORAL_TABLET | Freq: Four times a day (QID) | ORAL | 0 refills | Status: DC | PRN

## 2018-03-04 NOTE — ED Provider Notes (Signed)
[facility] Emergency Department Provider Note   ____________________________________________   First MD Initiated Contact with Patient 03/04/18 1028     (approximate)  I have reviewed the triage vital signs and the nursing notes.   HISTORY  Chief Complaint Fall    HPI Tina Cisneros is a 53 y.o. female patient complain left lateral rib pain secondary to a fall 2 days ago.  Patient that she slipped and fell down 2 steps.  Patient did pain increased with deep inspirations.  Patient rates pain as a 10/10.  Patient described the pain as "sharp/achy".  No palliative measures for complaint.    History reviewed. No pertinent past medical history.  There are no active problems to display for this patient.   History reviewed. No pertinent surgical history.  Prior to Admission medications   Medication Sig Start Date End Date Taking? Authorizing Provider  gabapentin (NEURONTIN) 300 MG capsule Take 300 mg by mouth 3 (three) times daily.   Yes [provider]  lamoTRIgine (LAMICTAL) 100 MG tablet Take 100 mg by mouth daily.   Yes [provider]  lurasidone (LATUDA) 80 MG TABS tablet Take 80 mg by mouth daily with breakfast.   Yes [provider]  traZODone (DESYREL) 50 MG tablet Take 50 mg by mouth at bedtime.   Yes [provider]  azithromycin (ZITHROMAX Z-PAK) 250 MG tablet Take 2 tablets (500 mg) on  Day 1,  followed by 1 tablet (250 mg) once daily on Days 2 through 5. 03/04/18 03/09/18  Joni Reining, PA-C  HYDROcodone-acetaminophen (NORCO/VICODIN) 5-325 MG tablet Take 1 tablet by mouth every 4 (four) hours as needed for moderate pain. 01/18/15   Cuthriell, Delorise Royals, PA-C  lidocaine (LIDODERM) 5 % Place 1 patch onto the skin every 12 (twelve) hours. Remove & Discard patch within 12 hours or as directed by MD 03/04/18 03/04/19  Joni Reining, PA-C  oxyCODONE-acetaminophen (PERCOCET) 7.5-325 MG tablet Take 1 tablet by mouth  every 6 (six) hours as needed. 03/04/18   Joni Reining, PA-C    Allergies Patient has no known allergies.  Family History  Problem Relation Age of Onset  . Breast cancer Sister     Social History Social History   Tobacco Use  . Smoking status: Current Some Day Smoker  . Smokeless tobacco: Never Used  Substance Use Topics  . Alcohol use: No  . Drug use: Not on file    Review of Systems Constitutional: No fever/chills Eyes: No visual changes. ENT: No sore throat. Cardiovascular: Denies chest pain. Respiratory: Denies shortness of breath. Gastrointestinal: No abdominal pain.  No nausea, no vomiting.  No diarrhea.  No constipation. Genitourinary: Negative for dysuria. Musculoskeletal: Left lateral rib pain. Skin: Negative for rash. Neurological: Negative for headaches, focal weakness or numbness. ____________________________________________   PHYSICAL EXAM:  VITAL SIGNS: ED Triage Vitals  Enc Vitals Group     BP 03/04/18 1028 126/67     Pulse Rate 03/04/18 1028 90     Resp --      Temp 03/04/18 1028 98.4 F (36.9 C)     Temp Source 03/04/18 1028 Oral     SpO2 03/04/18 1028 99 %     Weight 03/04/18 1032 146 lb (66.2 kg)     Height 03/04/18 1032 5\' 5"  (1.651 m)     Head Circumference --      Peak Flow --      Pain Score 03/04/18 1032 10  Pain Loc --      Pain Edu? --      Excl. in GC? --    Constitutional: Alert and oriented. Well appearing and in no acute distress. Neck: No stridor.  Hematological/Lymphatic/Immunilogical: No cervical lymphadenopathy. Cardiovascular: Normal rate, regular rhythm. Grossly normal heart sounds.  Good peripheral circulation. Respiratory: Left splinting with respiratory effort.  No retractions. Lungs CTAB. Gastrointestinal: Soft and nontender. No distention. No abdominal bruits. No CVA tenderness. Genitourinary: Deferred Musculoskeletal: No lower extremity tenderness nor edema.  No joint effusions. Neurologic:  Normal speech  and language. No gross focal neurologic deficits are appreciated. No gait instability. Skin:  Skin is warm, dry and intact. No rash noted. Psychiatric: Mood and affect are normal. Speech and behavior are normal.  ____________________________________________   LABS (all labs ordered are listed, but only abnormal results are displayed)  Labs Reviewed - No data to display ____________________________________________  EKG   ____________________________________________  RADIOLOGY  ED MD interpretation:    Official radiology report(s): Dg Ribs Unilateral W/chest Left  Result Date: 03/04/2018 CLINICAL DATA:  Larey Seat 2 days ago.  Lateral rib pain. EXAM: LEFT RIBS AND CHEST - 3+ VIEW COMPARISON:  Chest radiograph 04/27/2006 FINDINGS: Displaced fractures of the left sixth and seventh ribs. Negative for pneumothorax. Patchy interstitial densities in the mid and lower left chest. Few patchy densities at the right lung base. Stable nodular density along the lateral right lung could represent a calcified granuloma. Heart size is normal. The trachea is midline. IMPRESSION: Mildly displaced fractures involving the left sixth and seventh ribs. Patchy interstitial densities in both lungs, left side greater than right. Findings may be related to atelectasis but cannot exclude infectious or inflammatory process. Negative for a pneumothorax. Electronically Signed   By: Richarda Overlie M.D.   On: 03/04/2018 11:51    ____________________________________________   PROCEDURES  Procedure(s) performed (including Critical Care):  Procedures   ____________________________________________   INITIAL IMPRESSION / ASSESSMENT AND PLAN / ED COURSE  As part of my medical decision making, I reviewed the following data within the electronic MEDICAL RECORD NUMBER     Left lateral rib pain secondary to nondisplaced fracture of the sixth and seventh vertebrae.  Discussed x-ray findings with patient.  Patient given discharge  care instructions.  Patient advised take medication as directed and follow-up PCP in 5 days.  Return to ED if condition worsens.      ____________________________________________   FINAL CLINICAL IMPRESSION(S) / ED DIAGNOSES  Final diagnoses:  Closed fracture of multiple ribs of left side, initial encounter     ED Discharge Orders         Ordered    oxyCODONE-acetaminophen (PERCOCET) 7.5-325 MG tablet  Every 6 hours PRN     03/04/18 1202    lidocaine (LIDODERM) 5 %  Every 12 hours     03/04/18 1202    azithromycin (ZITHROMAX Z-PAK) 250 MG tablet     03/04/18 1202           Note:  This document was prepared using Dragon voice recognition software and may include unintentional dictation errors.    Joni Reining, PA-C 03/04/18 1204    Willy Eddy, MD 03/04/18 1520

## 2018-03-04 NOTE — ED Triage Notes (Signed)
Presents s/p fall  States she fell down steps 2 days ago landed on left side  Having increased pain to left rib area

## 2018-03-04 NOTE — ED Notes (Signed)
Pt discharged home after verbalizing understanding of discharge instructions; nad noted. 

## 2020-09-01 IMAGING — CR DG RIBS W/ CHEST 3+V*L*
1 series · 3 of 3 positions shown · non-contrast
Comparison: Chest radiograph 04/27/2006

CLINICAL DATA: Fell 2 days ago.  Lateral rib pain.

EXAM:
LEFT RIBS AND CHEST - 3+ VIEW

[Series 1: w chest pa · 0.14mm/px · 3 of 3 slices shown]
[im 1/3]
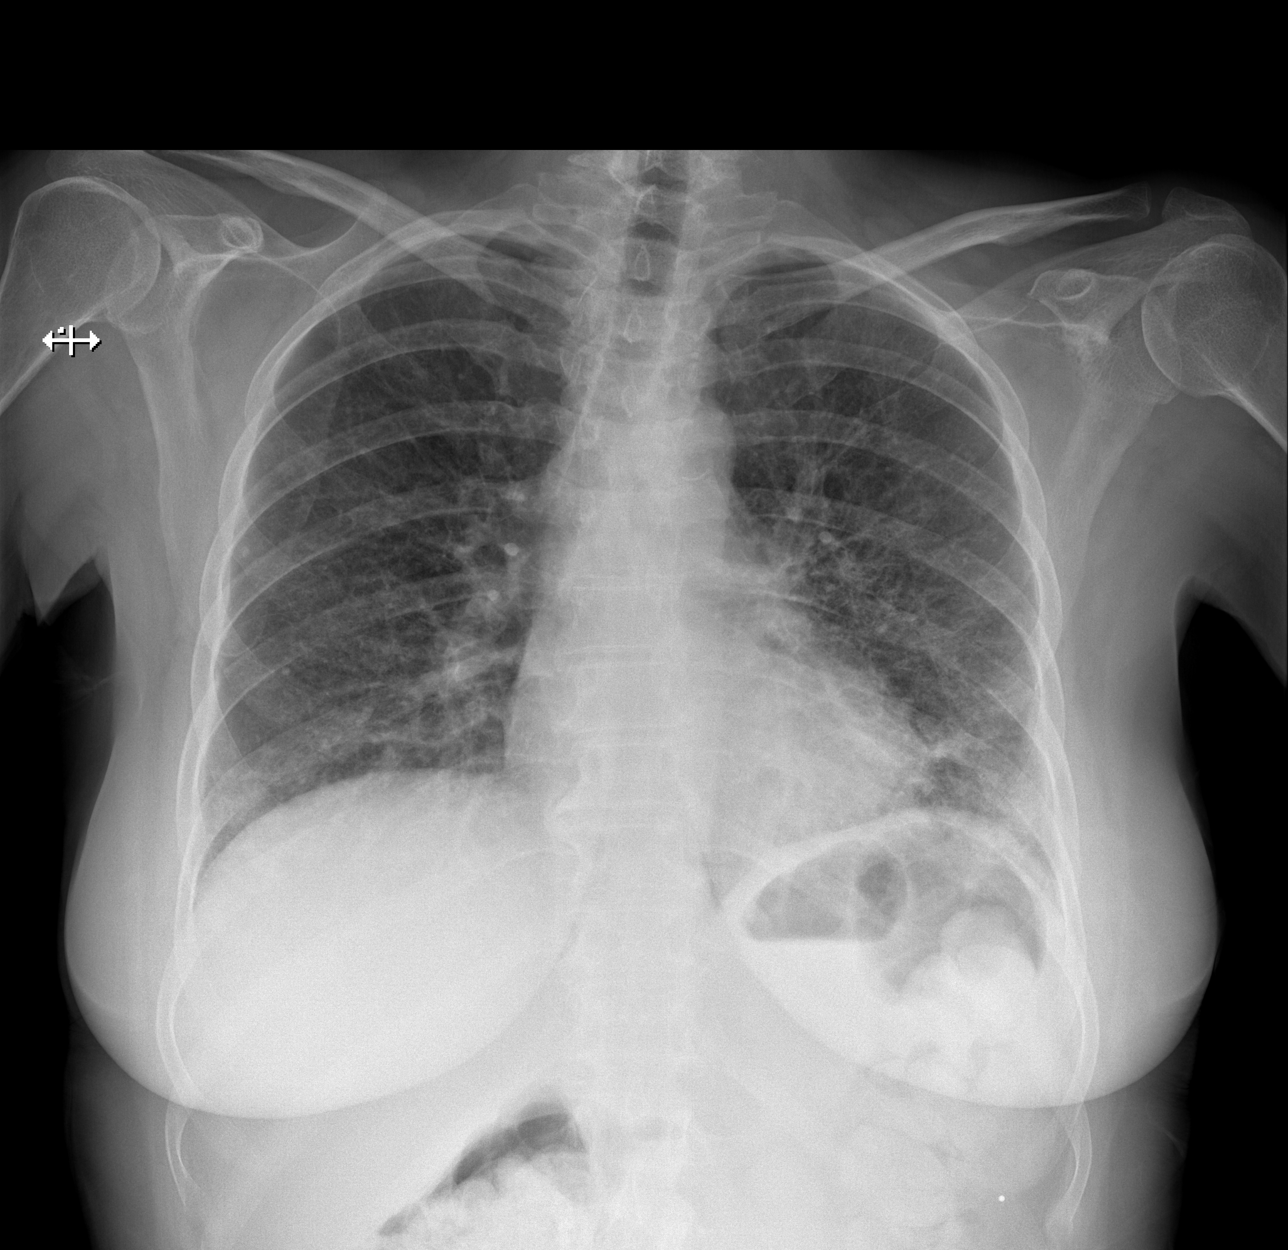
[im 2/3]
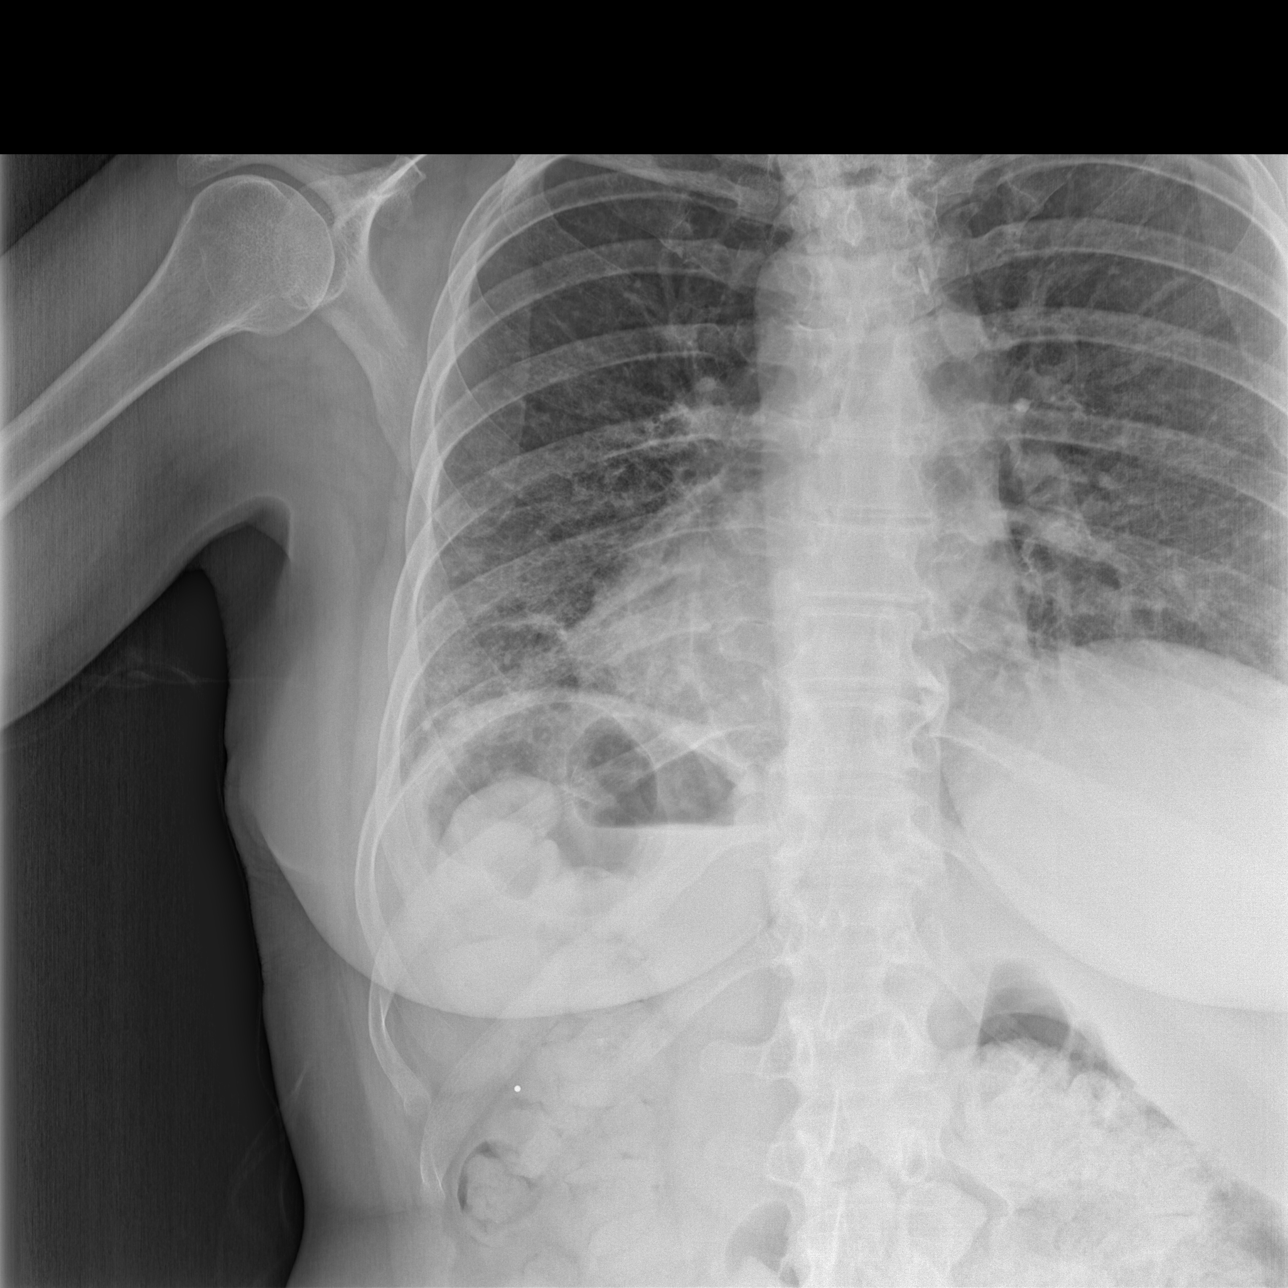
[im 3/3]
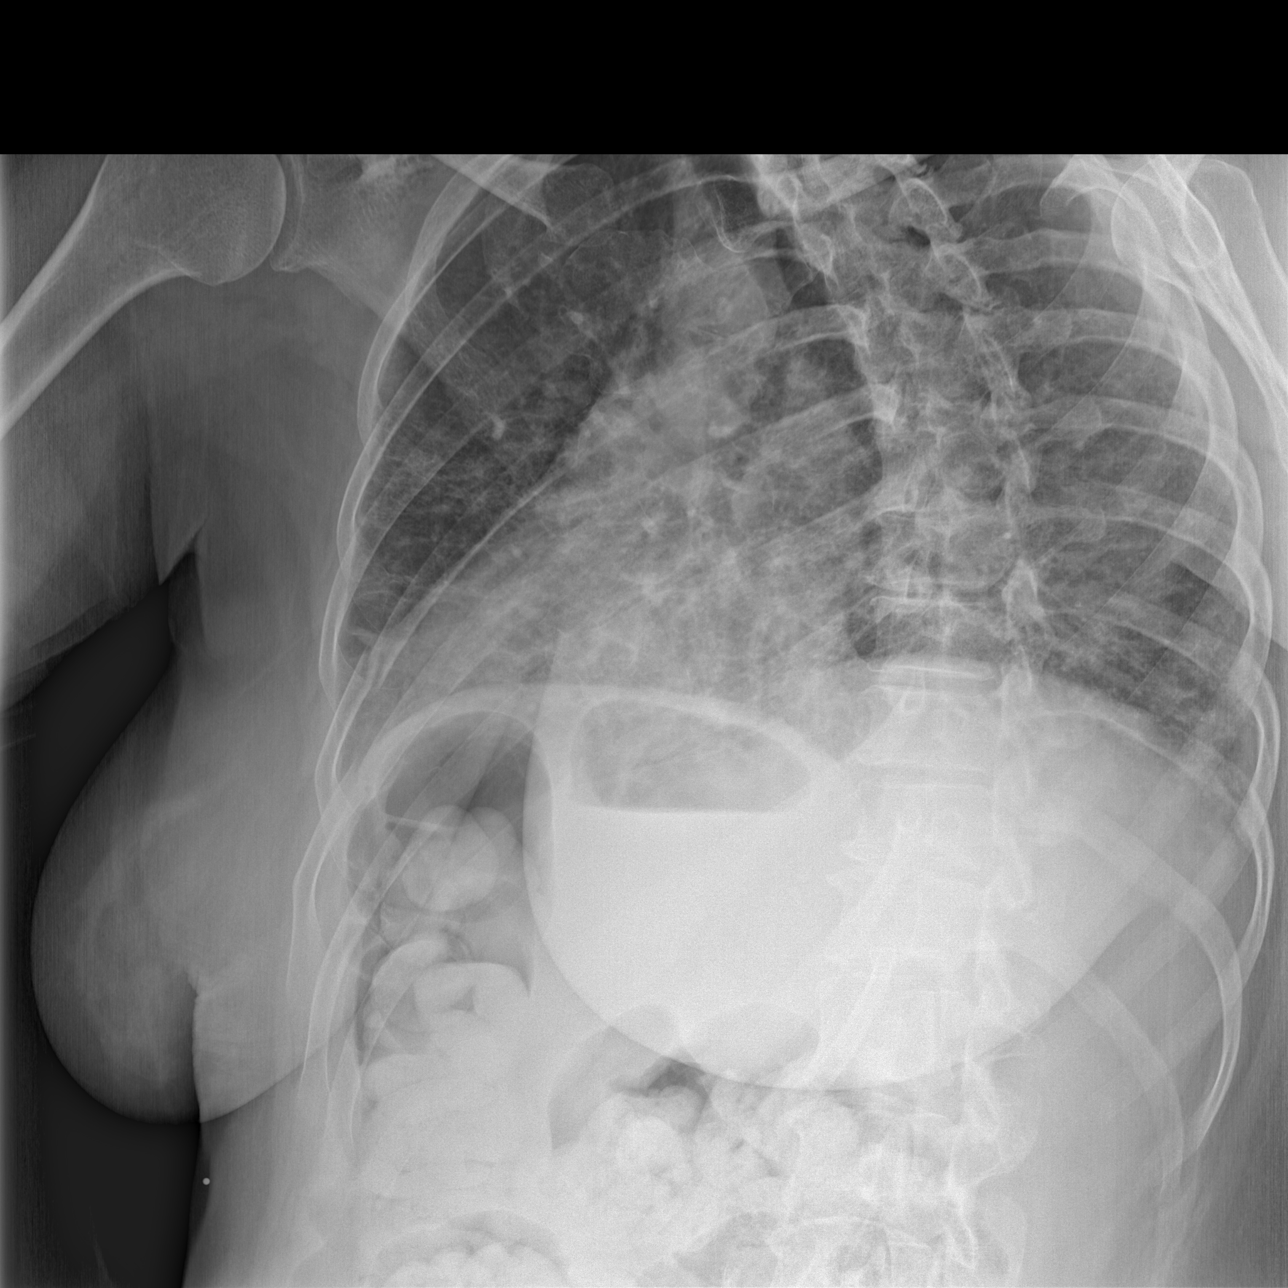

[3 of 3 positions shown; findings below may reference images not displayed]

FINDINGS: Displaced fractures of the left sixth and seventh ribs. Negative for
pneumothorax. Patchy interstitial densities in the mid and lower
left chest. Few patchy densities at the right lung base. Stable
nodular density along the lateral right lung could represent a
calcified granuloma. Heart size is normal. The trachea is midline.
IMPRESSION: Mildly displaced fractures involving the left sixth and seventh
ribs.

Patchy interstitial densities in both lungs, left side greater than
right. Findings may be related to atelectasis but cannot exclude
infectious or inflammatory process.

Negative for a pneumothorax.

## 2020-11-25 ENCOUNTER — Inpatient Hospital Stay: Payer: Self-pay | Admitting: Anesthesiology

## 2020-11-25 ENCOUNTER — Emergency Department: Payer: Self-pay

## 2020-11-25 ENCOUNTER — Inpatient Hospital Stay
Admission: EM | Admit: 2020-11-25 | Discharge: 2020-11-27 | DRG: 660 | Disposition: A | Discharge status: Home / Self Care | Payer: Self-pay | Attending: Internal Medicine | Admitting: Internal Medicine

## 2020-11-25 ENCOUNTER — Inpatient Hospital Stay: Payer: Self-pay

## 2020-11-25 ENCOUNTER — Other Ambulatory Visit: Payer: Self-pay

## 2020-11-25 ENCOUNTER — Encounter: Payer: Self-pay | Admitting: Emergency Medicine

## 2020-11-25 ENCOUNTER — Encounter
Admission: EM | Disposition: A | Discharge status: Home / Self Care | Payer: Self-pay | Source: Home / Self Care | Attending: Internal Medicine

## 2020-11-25 DIAGNOSIS — A4150 Gram-negative sepsis, unspecified: Secondary | ICD-10-CM

## 2020-11-25 DIAGNOSIS — E119 Type 2 diabetes mellitus without complications: Secondary | ICD-10-CM

## 2020-11-25 DIAGNOSIS — N390 Urinary tract infection, site not specified: Secondary | ICD-10-CM

## 2020-11-25 DIAGNOSIS — F3170 Bipolar disorder, currently in remission, most recent episode unspecified: Secondary | ICD-10-CM

## 2020-11-25 DIAGNOSIS — N1330 Unspecified hydronephrosis: Secondary | ICD-10-CM

## 2020-11-25 DIAGNOSIS — E876 Hypokalemia: Secondary | ICD-10-CM | POA: Diagnosis present

## 2020-11-25 DIAGNOSIS — E1165 Type 2 diabetes mellitus with hyperglycemia: Secondary | ICD-10-CM | POA: Diagnosis present

## 2020-11-25 DIAGNOSIS — K5900 Constipation, unspecified: Secondary | ICD-10-CM | POA: Diagnosis present

## 2020-11-25 DIAGNOSIS — N3001 Acute cystitis with hematuria: Secondary | ICD-10-CM

## 2020-11-25 DIAGNOSIS — N3090 Cystitis, unspecified without hematuria: Secondary | ICD-10-CM

## 2020-11-25 DIAGNOSIS — Z23 Encounter for immunization: Secondary | ICD-10-CM

## 2020-11-25 DIAGNOSIS — N200 Calculus of kidney: Secondary | ICD-10-CM

## 2020-11-25 DIAGNOSIS — E1100 Type 2 diabetes mellitus with hyperosmolarity without nonketotic hyperglycemic-hyperosmolar coma (NKHHC): Secondary | ICD-10-CM

## 2020-11-25 DIAGNOSIS — N139 Obstructive and reflux uropathy, unspecified: Secondary | ICD-10-CM

## 2020-11-25 DIAGNOSIS — E871 Hypo-osmolality and hyponatremia: Secondary | ICD-10-CM | POA: Diagnosis present

## 2020-11-25 DIAGNOSIS — B9620 Unspecified Escherichia coli [E. coli] as the cause of diseases classified elsewhere: Secondary | ICD-10-CM | POA: Diagnosis present

## 2020-11-25 DIAGNOSIS — N135 Crossing vessel and stricture of ureter without hydronephrosis: Secondary | ICD-10-CM

## 2020-11-25 DIAGNOSIS — N3941 Urge incontinence: Secondary | ICD-10-CM | POA: Diagnosis present

## 2020-11-25 DIAGNOSIS — R310 Gross hematuria: Secondary | ICD-10-CM

## 2020-11-25 DIAGNOSIS — Z79899 Other long term (current) drug therapy: Secondary | ICD-10-CM

## 2020-11-25 DIAGNOSIS — A419 Sepsis, unspecified organism: Secondary | ICD-10-CM

## 2020-11-25 DIAGNOSIS — G2401 Drug induced subacute dyskinesia: Secondary | ICD-10-CM | POA: Diagnosis present

## 2020-11-25 DIAGNOSIS — G43909 Migraine, unspecified, not intractable, without status migrainosus: Secondary | ICD-10-CM | POA: Diagnosis present

## 2020-11-25 DIAGNOSIS — F17210 Nicotine dependence, cigarettes, uncomplicated: Secondary | ICD-10-CM | POA: Diagnosis present

## 2020-11-25 DIAGNOSIS — F419 Anxiety disorder, unspecified: Secondary | ICD-10-CM | POA: Diagnosis present

## 2020-11-25 DIAGNOSIS — N136 Pyonephrosis: Principal | ICD-10-CM | POA: Diagnosis present

## 2020-11-25 DIAGNOSIS — G629 Polyneuropathy, unspecified: Secondary | ICD-10-CM | POA: Diagnosis present

## 2020-11-25 DIAGNOSIS — F319 Bipolar disorder, unspecified: Secondary | ICD-10-CM

## 2020-11-25 DIAGNOSIS — Z20822 Contact with and (suspected) exposure to covid-19: Secondary | ICD-10-CM | POA: Diagnosis present

## 2020-11-25 DIAGNOSIS — F3130 Bipolar disorder, current episode depressed, mild or moderate severity, unspecified: Secondary | ICD-10-CM | POA: Diagnosis present

## 2020-11-25 HISTORY — DX: Depression, unspecified: F32.A

## 2020-11-25 HISTORY — PX: CYSTOSCOPY WITH STENT PLACEMENT: SHX5790

## 2020-11-25 LAB — COMPREHENSIVE METABOLIC PANEL
ALT: 19 U/L (ref 0–44)
AST: 29 U/L (ref 15–41)
Albumin: 3.5 g/dL (ref 3.5–5.0)
Alkaline Phosphatase: 141 U/L — ABNORMAL HIGH (ref 38–126)
Anion gap: 13 (ref 5–15)
BUN: 11 mg/dL (ref 6–20)
CO2: 26 mmol/L (ref 22–32)
Calcium: 9.2 mg/dL (ref 8.9–10.3)
Chloride: 88 mmol/L — ABNORMAL LOW (ref 98–111)
Creatinine, Ser: 0.85 mg/dL (ref 0.44–1.00)
GFR, Estimated: >60 mL/min (ref >60)
Glucose, Bld: 748 mg/dL (ref 70–99)
Potassium: 3.5 mmol/L (ref 3.5–5.1)
Sodium: 127 mmol/L — ABNORMAL LOW (ref 135–145)
Total Bilirubin: 0.6 mg/dL (ref 0.3–1.2)
Total Protein: 7.2 g/dL (ref 6.5–8.1)

## 2020-11-25 LAB — URINALYSIS, ROUTINE W REFLEX MICROSCOPIC
Bilirubin Urine: NEGATIVE
Glucose, UA: >=500 mg/dL — AB
Ketones, ur: NEGATIVE mg/dL
Nitrite: POSITIVE — AB
Protein, ur: 100 mg/dL — AB
RBC / HPF: >50 RBC/hpf — ABNORMAL HIGH (ref 0–5)
Specific Gravity, Urine: 1.026 (ref 1.005–1.030)
pH: 7 (ref 5.0–8.0)

## 2020-11-25 LAB — CBC
HCT: 44 % (ref 36.0–46.0)
Hemoglobin: 14.3 g/dL (ref 12.0–15.0)
MCH: 31.3 pg (ref 26.0–34.0)
MCHC: 32.5 g/dL (ref 30.0–36.0)
MCV: 96.3 fL (ref 80.0–100.0)
Platelets: 253 10*3/uL (ref 150–400)
RBC: 4.57 MIL/uL (ref 3.87–5.11)
RDW: 12 % (ref 11.5–15.5)
WBC: 14.7 10*3/uL — ABNORMAL HIGH (ref 4.0–10.5)
nRBC: 0 % (ref 0.0–0.2)

## 2020-11-25 LAB — LIPASE, BLOOD: Lipase: 33 U/L (ref 11–51)

## 2020-11-25 LAB — RESP PANEL BY RT-PCR (FLU A&B, COVID) ARPGX2
Influenza A by PCR: NEGATIVE
Influenza B by PCR: NEGATIVE
SARS Coronavirus 2 by RT PCR: NEGATIVE

## 2020-11-25 LAB — CBG MONITORING, ED
Glucose-Capillary: 282 mg/dL — ABNORMAL HIGH (ref 70–99)
Glucose-Capillary: 312 mg/dL — ABNORMAL HIGH (ref 70–99)

## 2020-11-25 SURGERY — CYSTOSCOPY, WITH STENT INSERTION
Anesthesia: General | Laterality: Right

## 2020-11-25 MED ORDER — SODIUM CHLORIDE 0.9 % IR SOLN
Status: DC | PRN
  Administered 2020-11-25: 1200 mL

## 2020-11-25 MED ORDER — POTASSIUM CHLORIDE IN NACL 20-0.9 MEQ/L-% IV SOLN
INTRAVENOUS | Status: DC
  Filled 2020-11-25 (×3): qty 1000

## 2020-11-25 MED ORDER — ONDANSETRON HCL 4 MG/2ML IJ SOLN
4.0000 mg | Freq: Once | INTRAMUSCULAR | Status: AC
  Administered 2020-11-25: 4 mg via INTRAVENOUS
  Filled 2020-11-25: qty 2

## 2020-11-25 MED ORDER — INSULIN ASPART 100 UNIT/ML IJ SOLN
0.0000 [IU] | Freq: Three times a day (TID) | INTRAMUSCULAR | Status: DC
Start: 2020-11-26 — End: 2020-11-26

## 2020-11-25 MED ORDER — SODIUM CHLORIDE 0.9 % IV SOLN
2.0000 g | INTRAVENOUS | Status: DC
  Administered 2020-11-26: 2 g via INTRAVENOUS
  Filled 2020-11-25: qty 2
  Filled 2020-11-25: qty 20

## 2020-11-25 MED ORDER — SUCCINYLCHOLINE CHLORIDE 200 MG/10ML IV SOSY
PREFILLED_SYRINGE | INTRAVENOUS | Status: AC
  Filled 2020-11-25: qty 10

## 2020-11-25 MED ORDER — ONDANSETRON HCL 4 MG/2ML IJ SOLN
INTRAMUSCULAR | Status: DC | PRN
  Administered 2020-11-25: 4 mg via INTRAVENOUS

## 2020-11-25 MED ORDER — ONDANSETRON HCL 4 MG/2ML IJ SOLN: 4.0000 mg | Freq: Four times a day (QID) | INTRAMUSCULAR | Status: DC | PRN

## 2020-11-25 MED ORDER — ACETAMINOPHEN 10 MG/ML IV SOLN
INTRAVENOUS | Status: AC
  Filled 2020-11-25: qty 100

## 2020-11-25 MED ORDER — IOHEXOL 180 MG/ML  SOLN
INTRAMUSCULAR | Status: DC | PRN
  Administered 2020-11-25: 5 mL

## 2020-11-25 MED ORDER — EPHEDRINE SULFATE 50 MG/ML IJ SOLN
INTRAMUSCULAR | Status: DC | PRN
Start: 2020-11-25 — End: 2020-11-25
  Administered 2020-11-25: 10 mg via INTRAVENOUS
  Administered 2020-11-25: 15 mg via INTRAVENOUS

## 2020-11-25 MED ORDER — ACETAMINOPHEN 650 MG RE SUPP
650.0000 mg | Freq: Four times a day (QID) | RECTAL | Status: DC | PRN
  Filled 2020-11-25: qty 1

## 2020-11-25 MED ORDER — MORPHINE SULFATE (PF) 4 MG/ML IV SOLN
4.0000 mg | Freq: Once | INTRAVENOUS | Status: AC
  Administered 2020-11-25: 4 mg via INTRAVENOUS
  Filled 2020-11-25: qty 1

## 2020-11-25 MED ORDER — PROPOFOL 10 MG/ML IV BOLUS
INTRAVENOUS | Status: DC | PRN
  Administered 2020-11-25: 120 mg via INTRAVENOUS

## 2020-11-25 MED ORDER — PHENYLEPHRINE HCL-NACL 20-0.9 MG/250ML-% IV SOLN
INTRAVENOUS | Status: AC
  Filled 2020-11-25: qty 250

## 2020-11-25 MED ORDER — LAMOTRIGINE 100 MG PO TABS
100.0000 mg | ORAL_TABLET | Freq: Every day | ORAL | Status: DC
  Administered 2020-11-26: 100 mg via ORAL
  Filled 2020-11-25: qty 1

## 2020-11-25 MED ORDER — MIDAZOLAM HCL 2 MG/2ML IJ SOLN
INTRAMUSCULAR | Status: AC
  Filled 2020-11-25: qty 2

## 2020-11-25 MED ORDER — VASOPRESSIN 20 UNIT/ML IV SOLN
INTRAVENOUS | Status: DC | PRN
  Administered 2020-11-25: 2 [IU] via INTRAVENOUS

## 2020-11-25 MED ORDER — PROPOFOL 10 MG/ML IV BOLUS
INTRAVENOUS | Status: AC
  Filled 2020-11-25: qty 20

## 2020-11-25 MED ORDER — ACETAMINOPHEN 10 MG/ML IV SOLN
INTRAVENOUS | Status: DC | PRN
  Administered 2020-11-25: 1000 mg via INTRAVENOUS

## 2020-11-25 MED ORDER — KETOROLAC TROMETHAMINE 30 MG/ML IJ SOLN
15.0000 mg | INTRAMUSCULAR | Status: AC
  Administered 2020-11-25: 15 mg via INTRAVENOUS
  Filled 2020-11-25: qty 1

## 2020-11-25 MED ORDER — FENTANYL CITRATE (PF) 100 MCG/2ML IJ SOLN
INTRAMUSCULAR | Status: AC
  Filled 2020-11-25: qty 2

## 2020-11-25 MED ORDER — TRAZODONE HCL 50 MG PO TABS: 50.0000 mg | ORAL_TABLET | Freq: Every evening | ORAL | Status: DC | PRN

## 2020-11-25 MED ORDER — LACTATED RINGERS IV SOLN: INTRAVENOUS | Status: DC

## 2020-11-25 MED ORDER — TRAZODONE HCL 50 MG PO TABS: 50.0000 mg | ORAL_TABLET | Freq: Every day | ORAL | Status: DC

## 2020-11-25 MED ORDER — PNEUMOCOCCAL VAC POLYVALENT 25 MCG/0.5ML IJ INJ
0.5000 mL | INJECTION | INTRAMUSCULAR | Status: AC
  Administered 2020-11-26: 0.5 mL via INTRAMUSCULAR
  Filled 2020-11-25: qty 0.5

## 2020-11-25 MED ORDER — DEXAMETHASONE SODIUM PHOSPHATE 10 MG/ML IJ SOLN
INTRAMUSCULAR | Status: DC | PRN
  Administered 2020-11-25: 4 mg via INTRAVENOUS

## 2020-11-25 MED ORDER — LIDOCAINE HCL (PF) 2 % IJ SOLN
INTRAMUSCULAR | Status: AC
  Filled 2020-11-25: qty 5

## 2020-11-25 MED ORDER — SODIUM CHLORIDE 0.9 % IV SOLN
1.0000 g | Freq: Once | INTRAVENOUS | Status: AC
  Administered 2020-11-25: 1 g via INTRAVENOUS
  Filled 2020-11-25: qty 10

## 2020-11-25 MED ORDER — GLIMEPIRIDE 2 MG PO TABS
2.0000 mg | ORAL_TABLET | Freq: Every day | ORAL | Status: DC
  Filled 2020-11-25: qty 1

## 2020-11-25 MED ORDER — ACETAMINOPHEN 325 MG PO TABS
650.0000 mg | ORAL_TABLET | Freq: Four times a day (QID) | ORAL | Status: DC | PRN
  Administered 2020-11-26: 650 mg via ORAL
  Filled 2020-11-25: qty 2

## 2020-11-25 MED ORDER — FENTANYL CITRATE (PF) 100 MCG/2ML IJ SOLN
INTRAMUSCULAR | Status: DC | PRN
  Administered 2020-11-25: 50 ug via INTRAVENOUS

## 2020-11-25 MED ORDER — LIDOCAINE HCL (CARDIAC) PF 100 MG/5ML IV SOSY
PREFILLED_SYRINGE | INTRAVENOUS | Status: DC | PRN
  Administered 2020-11-25: 80 mg via INTRAVENOUS

## 2020-11-25 MED ORDER — HYDROMORPHONE HCL 1 MG/ML IJ SOLN: 0.2500 mg | INTRAMUSCULAR | Status: DC | PRN

## 2020-11-25 MED ORDER — POTASSIUM CHLORIDE 20 MEQ PO PACK
60.0000 meq | PACK | Freq: Once | ORAL | Status: AC
  Administered 2020-11-25: 60 meq via ORAL
  Filled 2020-11-25: qty 3

## 2020-11-25 MED ORDER — ONDANSETRON HCL 4 MG PO TABS: 4.0000 mg | ORAL_TABLET | Freq: Four times a day (QID) | ORAL | Status: DC | PRN

## 2020-11-25 MED ORDER — INSULIN ASPART 100 UNIT/ML IJ SOLN
15.0000 [IU] | Freq: Once | INTRAMUSCULAR | Status: AC
  Administered 2020-11-25: 15 [IU] via INTRAVENOUS
  Filled 2020-11-25: qty 1

## 2020-11-25 MED ORDER — ROCURONIUM BROMIDE 10 MG/ML (PF) SYRINGE
PREFILLED_SYRINGE | INTRAVENOUS | Status: AC
  Filled 2020-11-25: qty 10

## 2020-11-25 MED ORDER — DEXAMETHASONE SODIUM PHOSPHATE 10 MG/ML IJ SOLN
INTRAMUSCULAR | Status: AC
  Filled 2020-11-25: qty 1

## 2020-11-25 MED ORDER — ENOXAPARIN SODIUM 40 MG/0.4ML IJ SOSY
40.0000 mg | PREFILLED_SYRINGE | INTRAMUSCULAR | Status: DC
  Administered 2020-11-26: 40 mg via SUBCUTANEOUS
  Filled 2020-11-25: qty 0.4

## 2020-11-25 MED ORDER — HYDROCODONE-ACETAMINOPHEN 7.5-325 MG PO TABS: 1.0000 | ORAL_TABLET | Freq: Once | ORAL | Status: DC | PRN

## 2020-11-25 MED ORDER — BELLADONNA ALKALOIDS-OPIUM 16.2-60 MG RE SUPP
RECTAL | Status: AC
  Filled 2020-11-25: qty 1

## 2020-11-25 MED ORDER — ONDANSETRON HCL 4 MG/2ML IJ SOLN
INTRAMUSCULAR | Status: AC
  Filled 2020-11-25: qty 2

## 2020-11-25 MED ORDER — PHENYLEPHRINE HCL (PRESSORS) 10 MG/ML IV SOLN
INTRAVENOUS | Status: DC | PRN
  Administered 2020-11-25 (×2): 160 ug via INTRAVENOUS
  Administered 2020-11-25: 80 ug via INTRAVENOUS
  Administered 2020-11-25: 160 ug via INTRAVENOUS

## 2020-11-25 MED ORDER — GABAPENTIN 300 MG PO CAPS
300.0000 mg | ORAL_CAPSULE | Freq: Three times a day (TID) | ORAL | Status: DC
  Administered 2020-11-26: 300 mg via ORAL
  Filled 2020-11-25: qty 1

## 2020-11-25 MED ORDER — MAGNESIUM HYDROXIDE 400 MG/5ML PO SUSP: 30.0000 mL | Freq: Every day | ORAL | Status: DC | PRN

## 2020-11-25 MED ORDER — LACTATED RINGERS IV BOLUS
1000.0000 mL | Freq: Once | INTRAVENOUS | Status: AC
  Administered 2020-11-25: 1000 mL via INTRAVENOUS

## 2020-11-25 SURGICAL SUPPLY — 21 items
BAG DRAIN CYSTO-URO LG1000N (MISCELLANEOUS) ×2 IMPLANT
BAG URINE DRAIN 2000ML AR STRL (UROLOGICAL SUPPLIES) ×2 IMPLANT
CATH FOL 2WAY LX 16X5 (CATHETERS) ×2 IMPLANT
CATH URETL OPEN 5X70 (CATHETERS) ×2 IMPLANT
GAUZE 4X4 16PLY ~~LOC~~+RFID DBL (SPONGE) IMPLANT
GLOVE SURG ENC MOIS LTX SZ8 (GLOVE) ×4 IMPLANT
GOWN STRL REUS W/ TWL LRG LVL4 (GOWN DISPOSABLE) ×1 IMPLANT
GOWN STRL REUS W/ TWL XL LVL3 (GOWN DISPOSABLE) ×1 IMPLANT
GOWN STRL REUS W/TWL LRG LVL4 (GOWN DISPOSABLE) ×1
GOWN STRL REUS W/TWL XL LVL3 (GOWN DISPOSABLE) ×1
GUIDEWIRE STR DUAL SENSOR (WIRE) ×2 IMPLANT
HOLDER FOLEY CATH W/STRAP (MISCELLANEOUS) ×2 IMPLANT
IV NS IRRIG 3000ML ARTHROMATIC (IV SOLUTION) ×2 IMPLANT
KIT TURNOVER CYSTO (KITS) ×2 IMPLANT
MANIFOLD NEPTUNE II (INSTRUMENTS) IMPLANT
PACK CYSTO AR (MISCELLANEOUS) ×2 IMPLANT
SET CYSTO W/LG BORE CLAMP LF (SET/KITS/TRAYS/PACK) ×2 IMPLANT
STENT URET 6FRX24 CONTOUR (STENTS) ×2 IMPLANT
STENT URET 6FRX26 CONTOUR (STENTS) IMPLANT
WATER STERILE IRR 1000ML POUR (IV SOLUTION) ×2 IMPLANT
WATER STERILE IRR 500ML POUR (IV SOLUTION) ×2 IMPLANT

## 2020-11-25 NOTE — ED Provider Notes (Signed)
Emergency Medicine Provider Triage Evaluation Note  Tina Cisneros , a 55 y.o. female  was evaluated in triage.  Pt complains of complains of hematuria, lower abdominal pain.  Review of Systems  Positive: Hematuria, dysuria, lower abdominal pain Negative: Fever, chills, v/d  Physical Exam  BP (!) 148/81   Pulse (!) 106   Temp 98 F (36.7 C)   Resp 18   Ht 5\' 5"  (1.651 m)   Wt 67 kg   LMP 12/21/2014 (Approximate)   SpO2 92%   BMI 24.58 kg/m  Gen:   Awake, no distress   Resp:  Normal effort  MSK:   Moves extremities without difficulty  Other:  Abdomen nontender, no CVA tenderness  Medical Decision Making  Medically screening exam initiated at 1:48 PM.  Appropriate orders placed.  Tina Cisneros was informed that the remainder of the evaluation will be completed by another provider, this initial triage assessment does not replace that evaluation, and the importance of remaining in the ED until their evaluation is complete.     Hadley Pen, PA-C 11/25/20 1348    11/27/20, MD 11/25/20 [phone]

## 2020-11-25 NOTE — Anesthesia Procedure Notes (Signed)
Procedure Name: LMA Insertion Date/Time: 11/25/2020 9:42 PM Performed by: Omer Jack, CRNA Pre-anesthesia Checklist: Patient identified, Patient being monitored, Timeout performed, Emergency Drugs available and Suction available Patient Re-evaluated:Patient Re-evaluated prior to induction Oxygen Delivery Method: Circle system utilized Preoxygenation: Pre-oxygenation with 100% oxygen Induction Type: IV induction Ventilation: Mask ventilation without difficulty LMA: LMA inserted LMA Size: 4.0 Tube type: Oral Number of attempts: 1 Placement Confirmation: positive ETCO2 and breath sounds checked- equal and bilateral Tube secured with: Tape Dental Injury: Teeth and Oropharynx as per pre-operative assessment

## 2020-11-25 NOTE — Op Note (Signed)
Procedure: 1. Cystoscopy with right retrograde pyelogram and interpretation.  2. Cystoscopy with right ureteral stent insertion.  3. Application of fluoroscopy.  Preop Dx:  Right hydronephrosis with sepsis.  Postop Dx. Same.  Surgeon: Dr. Bjorn Pippin.  Anesthesia: General.  Drain: 34fr x 24cm right JJ stent.  Specimen: none.  EBL: none.  Complications: None.  Indication: The patient is a 55 yo female who was seen in the ER today with right hydronephrosis without clear etiology and early sepsis with an infected urine.  She is to have cystoscopy with retrograde and stent insertion.  Procedure: She was given Rocephin in the ER.  She was taken to the OR where a general anesthetic was induced.  She was placed in the lithotomy postion and prepped with betadine then draped in a sterile fashion.  The 69fr cystoscope was passed.  The urethral was normal.  The urine was bloody and turbid and after irrigation the bladder wall had diffuse inflammation without tumors or stones.  There was some inspissated pus emanating from the right UO and the left UO was unremarkable.   A 5fr open ended catheter and omnipaque were used to perform a right retrograde pyelogram.  The right retrograde pyelogram showed a slightly irregular right ureter without filling defects.  There was mildly dilated renal pelvis that appeared suggestive of a mild UPJ obstruction.  The calyces were fairly crisp.  A Sensor wire was passed to the kidney and a 22fr x 24cm contour JJ stent was passed to the kidney under fluoroscopic guidance.  The wire was removed leaving a good coil in the bladder and a good coil in the kidney.  The  bladder was drained and the scope was removed.  A 6fr foley was placed and the balloon was filled with 77ml of fluid.  The catheter was placed to a drainage bag.  Her anesthetic was reversed and she was moved to the PACU in stable condition.  There were no complications.

## 2020-11-25 NOTE — ED Triage Notes (Signed)
Pt reports blood in her urine. Pt states some abd pain as well. Pt states the blood is a good amount and bright red in color and the pain is her lower abd.

## 2020-11-25 NOTE — H&P (Addendum)
[place]   PATIENT NAME: Tina Cisneros    MR#:  [id number]  DATE OF BIRTH:  [birth year]  DATE OF ADMISSION:  11/25/2020  PRIMARY CARE PHYSICIAN: [facility] [place]   Patient is coming from: Home  REQUESTING/REFERRING PHYSICIAN: Brenton Grills, MD  CHIEF COMPLAINT:   Chief Complaint  Patient presents with   Hematuria   Abdominal Pain    HISTORY OF PRESENT ILLNESS:  Tina Cisneros is a 55 y.o. Caucasian female with medical history significant for bipolar depression, who presented to the ER with acute onset of hematuria that started on Wednesday with associated urinary frequency and urgency without dysuria.  He has been having right flank pain with his symptoms as well as lower abdominal suprapubic pain.  No chest pain or dyspnea or cough.  No fever or chills.  She has been constipated.  Her last bowel movement was about a week ago.  ED Course: Upon presentation to the emergency room, blood pressure was 148/81 with a heart rate of 106 with otherwise normal vital signs.  Labs revealed hyponatremia 127 hypokalemia chloremia of 88 with borderline potassium at 3.5.  Blood glucose was 748 and alk phos 141 with otherwise unremarkable CMP.  CBC showed leukocytosis of 14.7 with neutrophilia.  UA was strongly positive for UTI.  Influenza antigens and COVID-19 PCR came back negative.  Imaging: Abdominal pelvic CT scan revealed the following: 1. Mild to moderate right-sided obstructive uropathy. I do not see any radiopaque obstructing calculus or other cause for obstructive uropathy. Findings could reflect recent passage of urinary tract calculus or infection. 2. Punctate bilateral nonobstructing renal calculi as above. 3. Distal colonic diverticulosis without diverticulitis. 4. Hepatic steatosis. 5.  Aortic Atherosclerosis.  The patient was given 15 units of IV NovoLog, 15 mg IV Toradol, 1 L bolus of IV lactated Ringer's, 4 mg of IV morphine sulfate and 4 mg IV Zofran  as well as 60 mEq p.o. potassium chloride.  Dr. Jeffie Pollock was notified about the patient and will take her for cystoureteroscopy and stent placement.  She will be admitted to a medical monitored bed for further evaluation and management. PAST MEDICAL HISTORY:   Past Medical History:  Diagnosis Date   Depression   Bipolar disorder Anxiety Migraine  PAST SURGICAL HISTORY:   Past Surgical History:  Procedure Laterality Date   cesection      SOCIAL HISTORY:   Social History   Tobacco Use   Smoking status: Every Day    Packs/day: 1.00    Years: 25.00    Pack years: 25.00    Types: Cigarettes   Smokeless tobacco: Never  Substance Use Topics   Alcohol use: No    FAMILY HISTORY:   Family History  Problem Relation Age of Onset   Breast cancer Sister     DRUG ALLERGIES:  No Known Allergies  REVIEW OF SYSTEMS:   ROS As per history of present illness. All pertinent systems were reviewed above. Constitutional, HEENT, cardiovascular, respiratory, GI, GU, musculoskeletal, neuro, psychiatric, endocrine, integumentary and hematologic systems were reviewed and are otherwise negative/unremarkable except for positive findings mentioned above in the HPI.   MEDICATIONS AT HOME:   Prior to Admission medications   Medication Sig Start Date End Date Taking? Authorizing Provider  gabapentin (NEURONTIN) 300 MG capsule Take 300 mg by mouth 3 (three) times daily.    [provider]  HYDROcodone-acetaminophen (NORCO/VICODIN) 5-325 MG tablet Take 1 tablet by mouth every 4 (four) hours as needed  for moderate pain. 01/18/15   Cuthriell, Charline Bills, PA-C  lamoTRIgine (LAMICTAL) 100 MG tablet Take 100 mg by mouth daily.    [provider]  lurasidone (LATUDA) 80 MG TABS tablet Take 80 mg by mouth daily with breakfast.    [provider]  oxyCODONE-acetaminophen (PERCOCET) 7.5-325 MG tablet Take 1 tablet by mouth every 6 (six) hours as needed. 03/04/18   Sable Feil, PA-C   traZODone (DESYREL) 50 MG tablet Take 50 mg by mouth at bedtime.    [provider]      VITAL SIGNS:  Blood pressure 134/81, pulse (!) 106, temperature 98 F (36.7 C), resp. rate 18, height _0  (1.651 m), weight 67 kg, last menstrual period 12/21/2014, SpO2 97 %.  PHYSICAL EXAMINATION:  Physical Exam  GENERAL:  55 y.o.-year-old Caucasian female patient lying in the bed with no acute distress.  EYES: Pupils equal, round, reactive to light and accommodation. No scleral icterus. Extraocular muscles intact.  HEENT: Head atraumatic, normocephalic. Oropharynx and nasopharynx clear.  NECK:  Supple, no jugular venous distention. No thyroid enlargement, no tenderness.  LUNGS: Normal breath sounds bilaterally, no wheezing, rales,rhonchi or crepitation. No use of accessory muscles of respiration.  CARDIOVASCULAR: Regular rate and rhythm, S1, S2 normal. No murmurs, rubs, or gallops.  ABDOMEN: Soft, nondistended with suprapubic tenderness without rebound tenderness guarding or rigidity.  She had right CVA tenderness.  Bowel sounds present. No organomegaly or mass.  EXTREMITIES: No pedal edema, cyanosis, or clubbing.  NEUROLOGIC: Cranial nerves II through XII are intact. Muscle strength 5/5 in all extremities. Sensation intact. Gait not checked.  PSYCHIATRIC: The patient is alert and oriented x 3.  Normal affect and good eye contact. SKIN: No obvious rash, lesion, or ulcer.   LABORATORY PANEL:   CBC Recent Labs  Lab 11/25/20 1346  WBC 14.7*  HGB 14.3  HCT 44.0  PLT 253   ------------------------------------------------------------------------------------------------------------------  Chemistries  Recent Labs  Lab 11/25/20 1346  NA 127*  K 3.5  CL 88*  CO2 26  GLUCOSE 748*  BUN 11  CREATININE 0.85  CALCIUM 9.2  AST 29  ALT 19  ALKPHOS 141*  BILITOT 0.6    ------------------------------------------------------------------------------------------------------------------  Cardiac Enzymes No results for input(s): TROPONINI in the last 168 hours. ------------------------------------------------------------------------------------------------------------------  RADIOLOGY:  CT Renal Stone Study  Result Date: 11/25/2020 CLINICAL DATA:  Hematuria, lower abdominal pain EXAM: CT ABDOMEN AND PELVIS WITHOUT CONTRAST TECHNIQUE: Multidetector CT imaging of the abdomen and pelvis was performed following the standard protocol without IV contrast. COMPARISON:  None. FINDINGS: Lower chest: Bibasilar scarring and fibrosis. No acute pleural or parenchymal lung disease. Hepatobiliary: Diffuse hepatic steatosis with areas of fatty sparing in the right lobe liver. No intrahepatic biliary duct dilation. Gallbladder is unremarkable. Pancreas: Unremarkable. No pancreatic ductal dilatation or surrounding inflammatory changes. Spleen: Normal in size without focal abnormality. Adrenals/Urinary Tract: There is mild to moderate right-sided hydronephrosis and hydroureter. The ureter is dilated through the UVJ. I do not see any radiopaque calculus or other cause for obstruction. Findings could reflect recent passage of a urinary tract calculus. There is a punctate nonobstructing 2 mm calculus within the lower pole right kidney. On the left, there is a 4 mm nonobstructing calculus within the upper pole. No left-sided obstructive uropathy. The bladder is decompressed, limiting its evaluation. The adrenals are unremarkable. Stomach/Bowel: No bowel obstruction or ileus. Normal appendix right lower quadrant. There is diverticulosis of the descending and sigmoid colon without diverticulitis. No bowel wall thickening  or inflammatory change. Vascular/Lymphatic: Aortic atherosclerosis. No enlarged abdominal or pelvic lymph nodes. Reproductive: Uterus and bilateral adnexa are unremarkable. Other:  No free fluid or free gas.  No abdominal wall hernia. Musculoskeletal: No acute or destructive bony lesions. Reconstructed images demonstrate no additional findings. IMPRESSION: 1. Mild to moderate right-sided obstructive uropathy. I do not see any radiopaque obstructing calculus or other cause for obstructive uropathy. Findings could reflect recent passage of urinary tract calculus or infection. 2. Punctate bilateral nonobstructing renal calculi as above. 3. Distal colonic diverticulosis without diverticulitis. 4. Hepatic steatosis. 5.  Aortic Atherosclerosis (ICD10-I70.0). Electronically Signed   By: Randa Ngo M.D.   On: 11/25/2020 18:18      IMPRESSION AND PLAN:  Principal Problem:   UTI (urinary tract infection)  1.  Sepsis due to UTI in the setting of right obstructive uropathy. - The patient will be admitted to a medical monitored bed. - We will place her on IV Rocephin. - We will follow blood and urine cultures. - She will have a cysto-ureteroscopy and likely stent placement tonight by Dr. Jeffie Pollock. - Pain management will be provided.  2.  New onset uncontrolled type 2 diabetes mellitus with nonketotic hyperglycemia. - We will continue hydration with IV normal saline. - We will follow frequent fingerstick blood glucose. - We will check hemoglobin A1c. - Supplement coverage with NovoLog will be provided with resistant protocol. - Diabetic education will be provided. - We will start on p.o. Amaryl in AM.  3.  Bipolar disorder. - We will continue Lamictal and Latuda.  4.  Migraine and peripheral neuropathy. - We will continue Neurontin.  DVT prophylaxis: Lovenox. Code Status: full code. Family Communication:  The plan of care was discussed in details with the patient (and family). I answered all questions. The patient agreed to proceed with the above mentioned plan. Further management will depend upon hospital course. Disposition Plan: Back to previous home  environment Consults called: Neurology.  All the records are reviewed and case discussed with ED provider.  Status is: Inpatient   Remains inpatient appropriate because:Ongoing diagnostic testing needed not appropriate for outpatient work up, Unsafe d/c plan, IV treatments appropriate due to intensity of illness or inability to take PO, and Inpatient level of care appropriate due to severity of illness   Dispo: The patient is from: Home              Anticipated d/c is to: Home              Patient currently is not medically stable to d/c.              Difficult to place patient: No  TOTAL TIME TAKING CARE OF THIS PATIENT: 55 minutes.     Christel Mormon M.D on 11/25/2020 at 8:04 PM  [facility]   From 7 PM-7 AM, contact night-coverage [email or web address]  CC: Primary care physician; [facility]

## 2020-11-25 NOTE — Transfer of Care (Signed)
Immediate Anesthesia Transfer of Care Note  Patient: Tina Cisneros  Procedure(s) Performed: CYSTOSCOPY WITH STENT PLACEMENT (Right)  Patient Location: PACU  Anesthesia Type:General  Level of Consciousness: drowsy and patient cooperative  Airway & Oxygen Therapy: Patient Spontanous Breathing and Patient connected to face mask oxygen  Post-op Assessment: Report given to RN and Post -op Vital signs reviewed and stable  Post vital signs: Reviewed and stable  Last Vitals:  Vitals Value Taken Time  BP 117/63 11/25/20 2215  Temp    Pulse 90 11/25/20 2215  Resp 15 11/25/20 2215  SpO2 100 % 11/25/20 2215  Vitals shown include unvalidated device data.  Last Pain:  Vitals:   11/25/20 1323  PainSc: 6          Complications: No notable events documented.

## 2020-11-25 NOTE — Consult Note (Signed)
Subjective: 1. Cystitis   2. Diabetes mellitus, new onset ([place])   3. Ureteral obstruction, right      Consult requested by Carrie Mew MD  I was called to see Tina Cisneros regarding right hydro with possible sepsis.   She reports that she began to have increased urge incontinence about a month prior to this visit requiring pads and then on Thursday of last week she began to have gross hematuria followed by suprapubic and right flank pain that became severe enough to come to the ER.  She has had no fever or nausea but was tachycardic on admission to the ER with a leukocytosis and marked hyperglycemia.  Her UA is consistent with infection and a CT showed moderate right hydro into the upper proximal ureter.  The source of obstruction is not clear.  She does have small bilateral non-obstructing renal stones.   Her blood sugar has declined from over 700 on admission to 282 with treatment but she remains tachycardic and has continued pain.   ROS:  Review of Systems  Constitutional:  Negative for chills and fever.  Respiratory:  Negative for shortness of breath.   Cardiovascular:  Negative for chest pain.  Gastrointestinal:  Positive for abdominal pain. Negative for nausea and vomiting.  Genitourinary:  Positive for flank pain, hematuria and urgency.  All other systems reviewed and are negative.  No Known Allergies  Past Medical History:  Diagnosis Date   Depression     Past Surgical History:  Procedure Laterality Date   cesection      Social History   Socioeconomic History   Marital status: Married    Spouse name: Not on file   Number of children: Not on file   Years of education: Not on file   Highest education level: Not on file  Occupational History   Not on file  Tobacco Use   Smoking status: Every Day    Packs/day: 1.00    Years: 25.00    Pack years: 25.00    Types: Cigarettes   Smokeless tobacco: Never  Vaping Use   Vaping Use: Never used  Substance and Sexual  Activity   Alcohol use: No   Drug use: Not on file   Sexual activity: Not on file  Other Topics Concern   Not on file  Social History Narrative   Not on file   Social Determinants of Health   Financial Resource Strain: Not on file  Food Insecurity: Not on file  Transportation Needs: Not on file  Physical Activity: Not on file  Stress: Not on file  Social Connections: Not on file  Intimate Partner Violence: Not on file    Family History  Problem Relation Age of Onset   Breast cancer Sister     Anti-infectives: Anti-infectives (From admission, onward)    Start     Dose/Rate Route Frequency Ordered Stop   11/25/20 2045  cefTRIAXone (ROCEPHIN) 2 g in sodium chloride 0.9 % 100 mL IVPB        2 g 200 mL/hr over 30 Minutes Intravenous Every 24 hours 11/25/20 2031 12/02/20 2029   11/25/20 1700  cefTRIAXone (ROCEPHIN) 1 g in sodium chloride 0.9 % 100 mL IVPB        1 g 200 mL/hr over 30 Minutes Intravenous  Once 11/25/20 1648 11/25/20 1830       Current Facility-Administered Medications  Medication Dose Route Frequency Provider Last Rate Last Admin   0.9 % NaCl with KCl 20 mEq/ L  infusion   Intravenous Continuous Mansy, Jan A, MD       acetaminophen (TYLENOL) tablet 650 mg  650 mg Oral Q6H PRN Mansy, Jan A, MD       Or   acetaminophen (TYLENOL) suppository 650 mg  650 mg Rectal Q6H PRN Mansy, Jan A, MD       cefTRIAXone (ROCEPHIN) 2 g in sodium chloride 0.9 % 100 mL IVPB  2 g Intravenous Q24H Mansy, Jan A, MD       enoxaparin (LOVENOX) injection 40 mg  40 mg Subcutaneous Q24H Mansy, Jan A, MD       gabapentin (NEURONTIN) capsule 300 mg  300 mg Oral TID Mansy, Arvella Merles, MD       lactated ringers infusion   Intravenous Continuous Carrie Mew, MD 125 mL/hr at 11/25/20 1949 New Bag at 11/25/20 1949   lamoTRIgine (LAMICTAL) tablet 100 mg  100 mg Oral Daily Mansy, Jan A, MD       magnesium hydroxide (MILK OF MAGNESIA) suspension 30 mL  30 mL Oral Daily PRN Mansy, Jan A, MD        ondansetron [facility]) tablet 4 mg  4 mg Oral Q6H PRN Mansy, Jan A, MD       Or   ondansetron [facility]) injection 4 mg  4 mg Intravenous Q6H PRN Mansy, Jan A, MD       traZODone (DESYREL) tablet 50 mg  50 mg Oral QHS Mansy, Jan A, MD       traZODone (DESYREL) tablet 50 mg  50 mg Oral QHS PRN Mansy, Arvella Merles, MD       Current Outpatient Medications  Medication Sig Dispense Refill   gabapentin (NEURONTIN) 300 MG capsule Take 300 mg by mouth 3 (three) times daily.     HYDROcodone-acetaminophen (NORCO/VICODIN) 5-325 MG tablet Take 1 tablet by mouth every 4 (four) hours as needed for moderate pain. 20 tablet 0   lamoTRIgine (LAMICTAL) 100 MG tablet Take 100 mg by mouth daily.     lurasidone (LATUDA) 80 MG TABS tablet Take 80 mg by mouth daily with breakfast.     oxyCODONE-acetaminophen (PERCOCET) 7.5-325 MG tablet Take 1 tablet by mouth every 6 (six) hours as needed. 20 tablet 0   traZODone (DESYREL) 50 MG tablet Take 50 mg by mouth at bedtime.       Objective: Vital signs in last 24 hours: BP 134/81 (BP Location: Left Arm)   Pulse (!) 106   Temp 98 F (36.7 C)   Resp 18   Ht 5\' 5"  (1.651 m)   Wt 67 kg   LMP 12/21/2014 (Approximate)   SpO2 97%   BMI 24.58 kg/m   Intake/Output from previous day: No intake/output data recorded. Intake/Output this shift: Total I/O In: 1000 [IV Piggyback:1000] Out: -    Physical Exam Vitals reviewed.  Constitutional:      Appearance: She is well-developed.  Cardiovascular:     Rate and Rhythm: Regular rhythm. Tachycardia present.     Heart sounds: Normal heart sounds.  Pulmonary:     Effort: Pulmonary effort is normal. No respiratory distress.     Breath sounds: Normal breath sounds.  Abdominal:     General: Abdomen is flat.     Palpations: Abdomen is soft.     Tenderness: There is abdominal tenderness in the suprapubic area. There is right CVA tenderness.  Skin:    General: Skin is warm and dry.  Neurological:     General: No focal  deficit present.     Mental Status: She is alert and oriented to person, place, and time.  Psychiatric:        Mood and Affect: Mood normal.        Behavior: Behavior normal.    Lab Results:  Results for orders placed or performed during the hospital encounter of 11/25/20 (from the past 24 hour(s))  Lipase, blood     Status: None   Collection Time: 11/25/20  1:46 PM  Result Value Ref Range   Lipase 33 11 - 51 U/L  Comprehensive metabolic panel     Status: Abnormal   Collection Time: 11/25/20  1:46 PM  Result Value Ref Range   Sodium 127 (L) 135 - 145 mmol/L   Potassium 3.5 3.5 - 5.1 mmol/L   Chloride 88 (L) 98 - 111 mmol/L   CO2 26 22 - 32 mmol/L   Glucose, Bld 748 (HH) 70 - 99 mg/dL   BUN 11 6 - 20 mg/dL   Creatinine, Ser 0.85 0.44 - 1.00 mg/dL   Calcium 9.2 8.9 - 10.3 mg/dL   Total Protein 7.2 6.5 - 8.1 g/dL   Albumin 3.5 3.5 - 5.0 g/dL   AST 29 15 - 41 U/L   ALT 19 0 - 44 U/L   Alkaline Phosphatase 141 (H) 38 - 126 U/L   Total Bilirubin 0.6 0.3 - 1.2 mg/dL   GFR, Estimated >60 >60 mL/min   Anion gap 13 5 - 15  CBC     Status: Abnormal   Collection Time: 11/25/20  1:46 PM  Result Value Ref Range   WBC 14.7 (H) 4.0 - 10.5 K/uL   RBC 4.57 3.87 - 5.11 MIL/uL   Hemoglobin 14.3 12.0 - 15.0 g/dL   HCT 44.0 36.0 - 46.0 %   MCV 96.3 80.0 - 100.0 fL   MCH 31.3 26.0 - 34.0 pg   MCHC 32.5 30.0 - 36.0 g/dL   RDW 12.0 11.5 - 15.5 %   Platelets 253 150 - 400 K/uL   nRBC 0.0 0.0 - 0.2 %  Urinalysis, Routine w reflex microscopic     Status: Abnormal   Collection Time: 11/25/20  1:46 PM  Result Value Ref Range   Color, Urine YELLOW (A) YELLOW   APPearance HAZY (A) CLEAR   Specific Gravity, Urine 1.026 1.005 - 1.030   pH 7.0 5.0 - 8.0   Glucose, UA >=500 (A) NEGATIVE mg/dL   Hgb urine dipstick MODERATE (A) NEGATIVE   Bilirubin Urine NEGATIVE NEGATIVE   Ketones, ur NEGATIVE NEGATIVE mg/dL   Protein, ur 100 (A) NEGATIVE mg/dL   Nitrite POSITIVE (A) NEGATIVE   Leukocytes,Ua  LARGE (A) NEGATIVE   RBC / HPF >50 (H) 0 - 5 RBC/hpf   WBC, UA 21-50 0 - 5 WBC/hpf   Bacteria, UA MANY (A) NONE SEEN   Squamous Epithelial / LPF 0-5 0 - 5  CBG monitoring, ED     Status: Abnormal   Collection Time: 11/25/20  7:27 PM  Result Value Ref Range   Glucose-Capillary 282 (H) 70 - 99 mg/dL    BMET Recent Labs    11/25/20 1346  NA 127*  K 3.5  CL 88*  CO2 26  GLUCOSE 748*  BUN 11  CREATININE 0.85  CALCIUM 9.2   PT/INR No results for input(s): LABPROT, INR in the last 72 hours. ABG No results for input(s): PHART, HCO3 in the last 72 hours.  Invalid input(s): PCO2, PO2  Studies/Results: CT Renal  Stone Study  Result Date: 11/25/2020 CLINICAL DATA:  Hematuria, lower abdominal pain EXAM: CT ABDOMEN AND PELVIS WITHOUT CONTRAST TECHNIQUE: Multidetector CT imaging of the abdomen and pelvis was performed following the standard protocol without IV contrast. COMPARISON:  None. FINDINGS: Lower chest: Bibasilar scarring and fibrosis. No acute pleural or parenchymal lung disease. Hepatobiliary: Diffuse hepatic steatosis with areas of fatty sparing in the right lobe liver. No intrahepatic biliary duct dilation. Gallbladder is unremarkable. Pancreas: Unremarkable. No pancreatic ductal dilatation or surrounding inflammatory changes. Spleen: Normal in size without focal abnormality. Adrenals/Urinary Tract: There is mild to moderate right-sided hydronephrosis and hydroureter. The ureter is dilated through the UVJ. I do not see any radiopaque calculus or other cause for obstruction. Findings could reflect recent passage of a urinary tract calculus. There is a punctate nonobstructing 2 mm calculus within the lower pole right kidney. On the left, there is a 4 mm nonobstructing calculus within the upper pole. No left-sided obstructive uropathy. The bladder is decompressed, limiting its evaluation. The adrenals are unremarkable. Stomach/Bowel: No bowel obstruction or ileus. Normal appendix right  lower quadrant. There is diverticulosis of the descending and sigmoid colon without diverticulitis. No bowel wall thickening or inflammatory change. Vascular/Lymphatic: Aortic atherosclerosis. No enlarged abdominal or pelvic lymph nodes. Reproductive: Uterus and bilateral adnexa are unremarkable. Other: No free fluid or free gas.  No abdominal wall hernia. Musculoskeletal: No acute or destructive bony lesions. Reconstructed images demonstrate no additional findings. IMPRESSION: 1. Mild to moderate right-sided obstructive uropathy. I do not see any radiopaque obstructing calculus or other cause for obstructive uropathy. Findings could reflect recent passage of urinary tract calculus or infection. 2. Punctate bilateral nonobstructing renal calculi as above. 3. Distal colonic diverticulosis without diverticulitis. 4. Hepatic steatosis. 5.  Aortic Atherosclerosis (ICD10-I70.0). Electronically Signed   By: Sharlet Salina M.D.   On: 11/25/2020 18:18    I have reviewed the hospital notes, labs and imaging and communicated with Dr. Scotty Court and Dr. Arville Care.   Assessment/Plan: Right hydronephrosis with urinary tract infection and early sepsis.   A clear cause of obstruction is no apparent but with the UTI and associated signs and symptoms suggestive of sepsis, I will get her set up for cystoscopy and stenting this evening.   I have reviewed the risks including bleeding, infection, ureteral injury, need for secondary procedures, thrombotic events and anesthetic complications.    Bilateral renal stones.  She has small bilateral renal stones without obstruction.  She could have passed a stone but the obstruction could be from a clot, anatomic anomaly or possibly papillary necrosis considering the hyperglycemia on admission.    Gross hematuria.  This could have been from the UTI or a stone, but with her smoking history, cystoscopy is indicated.          No follow-ups on file.    CC: Dr. Sharman Cheek     Bjorn Pippin 11/25/2020 [phone]

## 2020-11-25 NOTE — ED Provider Notes (Signed)
[facility] Emergency Department Provider Note  ____________________________________________  Time seen: Approximately 6:27 PM  I have reviewed the triage vital signs and the nursing notes.   HISTORY  Chief Complaint Hematuria and Abdominal Pain    HPI Tina Cisneros is a 55 y.o. female with no significant past medical history who comes ED complaining of suprapubic pain and right flank pain for the past 4 days.  Associated with hematuria dysuria and urinary frequency.  Denies a history of diabetes.  Denies fever or vomiting, no diarrhea or constipation.  Pain is waxing and waning, no alleviating factors.  Moderate intensity.    Past Medical History:  Diagnosis Date   Depression      Patient Active Problem List   Diagnosis Date Noted   UTI (urinary tract infection) 11/25/2020      Past Surgical History:  Procedure Laterality Date   cesection       Prior to Admission medications   Medication Sig Start Date End Date Taking? Authorizing Provider  gabapentin (NEURONTIN) 300 MG capsule Take 300 mg by mouth 3 (three) times daily.    [provider]  HYDROcodone-acetaminophen (NORCO/VICODIN) 5-325 MG tablet Take 1 tablet by mouth every 4 (four) hours as needed for moderate pain. 01/18/15   Cuthriell, Charline Bills, PA-C  lamoTRIgine (LAMICTAL) 100 MG tablet Take 100 mg by mouth daily.    [provider]  lurasidone (LATUDA) 80 MG TABS tablet Take 80 mg by mouth daily with breakfast.    [provider]  oxyCODONE-acetaminophen (PERCOCET) 7.5-325 MG tablet Take 1 tablet by mouth every 6 (six) hours as needed. 03/04/18   Sable Feil, PA-C  traZODone (DESYREL) 50 MG tablet Take 50 mg by mouth at bedtime.    [provider]     Allergies Patient has no known allergies.   Family History  Problem Relation Age of Onset   Breast cancer Sister     Social History Social History   Tobacco Use   Smoking status:  Every Day    Packs/day: 1.00    Years: 25.00    Pack years: 25.00    Types: Cigarettes   Smokeless tobacco: Never  Vaping Use   Vaping Use: Never used  Substance Use Topics   Alcohol use: No    Review of Systems  Constitutional:   No fever or chills.  ENT:   No sore throat. No rhinorrhea. Cardiovascular:   No chest pain or syncope. Respiratory:   No dyspnea or cough. Gastrointestinal:   Positive as above for suprapubic pain and right flank pain without vomiting and diarrhea.  Musculoskeletal:   Negative for focal pain or swelling All other systems reviewed and are negative except as documented above in ROS and HPI.  ____________________________________________   PHYSICAL EXAM:  VITAL SIGNS: ED Triage Vitals  Enc Vitals Group     BP 11/25/20 1344 (!) 148/81     Pulse Rate 11/25/20 1344 (!) 106     Resp 11/25/20 1344 18     Temp 11/25/20 1344 98 F (36.7 C)     Temp src --      SpO2 11/25/20 1344 92 %     Weight 11/25/20 1323 147 lb 11.3 oz (67 kg)     Height 11/25/20 1323 5\' 5"  (1.651 m)     Head Circumference --      Peak Flow --      Pain Score 11/25/20 1323 6     Pain Loc --  Pain Edu? --      Excl. in [place]? --     Vital signs reviewed, nursing assessments reviewed.   Constitutional:   Alert and oriented. Non-toxic appearance. Eyes:   Conjunctivae are normal. EOMI. PERRL. ENT      Head:   Normocephalic and atraumatic.      Nose:   Normal.      Mouth/Throat:   Dry mucous membranes.      Neck:   No meningismus. Full ROM. Hematological/Lymphatic/Immunilogical:   No cervical lymphadenopathy. Cardiovascular:   Tachycardia heart rate 105. Symmetric bilateral radial and DP pulses.  No murmurs. Cap refill less than 2 seconds. Respiratory:   Normal respiratory effort without tachypnea/retractions. Breath sounds are clear and equal bilaterally. No wheezes/rales/rhonchi. Gastrointestinal:   Soft with suprapubic tenderness. Non distended. There is no CVA  tenderness.  No rebound, rigidity, or guarding. Genitourinary:   deferred Musculoskeletal:   Normal range of motion in all extremities. No joint effusions.  No lower extremity tenderness.  No edema. Neurologic:   Normal speech and language.  Motor grossly intact. No acute focal neurologic deficits are appreciated.  Skin:    Skin is warm, dry and intact. No rash noted.  No petechiae, purpura, or bullae.  ____________________________________________    LABS (pertinent positives/negatives) (all labs ordered are listed, but only abnormal results are displayed) Labs Reviewed  COMPREHENSIVE METABOLIC PANEL - Abnormal; Notable for the following components:      Result Value   Sodium 127 (*)    Chloride 88 (*)    Glucose, Bld 748 (*)    Alkaline Phosphatase 141 (*)    All other components within normal limits  CBC - Abnormal; Notable for the following components:   WBC 14.7 (*)    All other components within normal limits  URINALYSIS, ROUTINE W REFLEX MICROSCOPIC - Abnormal; Notable for the following components:   Color, Urine YELLOW (*)    APPearance HAZY (*)    Glucose, UA >=500 (*)    Hgb urine dipstick MODERATE (*)    Protein, ur 100 (*)    Nitrite POSITIVE (*)    Leukocytes,Ua LARGE (*)    RBC / HPF >50 (*)    Bacteria, UA MANY (*)    All other components within normal limits  CBG MONITORING, ED - Abnormal; Notable for the following components:   Glucose-Capillary 282 (*)    All other components within normal limits  URINE CULTURE  RESP PANEL BY RT-PCR (FLU A&B, COVID) ARPGX2  LIPASE, BLOOD  HEMOGLOBIN A1C   ____________________________________________   EKG    ____________________________________________    RADIOLOGY  CT Renal Stone Study  Result Date: 11/25/2020 CLINICAL DATA:  Hematuria, lower abdominal pain EXAM: CT ABDOMEN AND PELVIS WITHOUT CONTRAST TECHNIQUE: Multidetector CT imaging of the abdomen and pelvis was performed following the standard protocol  without IV contrast. COMPARISON:  None. FINDINGS: Lower chest: Bibasilar scarring and fibrosis. No acute pleural or parenchymal lung disease. Hepatobiliary: Diffuse hepatic steatosis with areas of fatty sparing in the right lobe liver. No intrahepatic biliary duct dilation. Gallbladder is unremarkable. Pancreas: Unremarkable. No pancreatic ductal dilatation or surrounding inflammatory changes. Spleen: Normal in size without focal abnormality. Adrenals/Urinary Tract: There is mild to moderate right-sided hydronephrosis and hydroureter. The ureter is dilated through the UVJ. I do not see any radiopaque calculus or other cause for obstruction. Findings could reflect recent passage of a urinary tract calculus. There is a punctate nonobstructing 2 mm calculus within the lower pole right  kidney. On the left, there is a 4 mm nonobstructing calculus within the upper pole. No left-sided obstructive uropathy. The bladder is decompressed, limiting its evaluation. The adrenals are unremarkable. Stomach/Bowel: No bowel obstruction or ileus. Normal appendix right lower quadrant. There is diverticulosis of the descending and sigmoid colon without diverticulitis. No bowel wall thickening or inflammatory change. Vascular/Lymphatic: Aortic atherosclerosis. No enlarged abdominal or pelvic lymph nodes. Reproductive: Uterus and bilateral adnexa are unremarkable. Other: No free fluid or free gas.  No abdominal wall hernia. Musculoskeletal: No acute or destructive bony lesions. Reconstructed images demonstrate no additional findings. IMPRESSION: 1. Mild to moderate right-sided obstructive uropathy. I do not see any radiopaque obstructing calculus or other cause for obstructive uropathy. Findings could reflect recent passage of urinary tract calculus or infection. 2. Punctate bilateral nonobstructing renal calculi as above. 3. Distal colonic diverticulosis without diverticulitis. 4. Hepatic steatosis. 5.  Aortic Atherosclerosis  (ICD10-I70.0). Electronically Signed   By: Randa Ngo M.D.   On: 11/25/2020 18:18    ____________________________________________   PROCEDURES Procedures  ____________________________________________  DIFFERENTIAL DIAGNOSIS   Cystitis, kidney stone, dehydration, electrolyte abnormality  CLINICAL IMPRESSION / ASSESSMENT AND PLAN / ED COURSE  Medications ordered in the ED: Medications  lactated ringers infusion ( Intravenous New Bag/Given 11/25/20 1949)  lactated ringers bolus 1,000 mL (0 mLs Intravenous Stopped 11/25/20 1917)  cefTRIAXone (ROCEPHIN) 1 g in sodium chloride 0.9 % 100 mL IVPB (0 g Intravenous Stopped 11/25/20 1830)  insulin aspart (novoLOG) injection 15 Units (15 Units Intravenous Given 11/25/20 1804)  potassium chloride (KLOR-CON) packet 60 mEq (60 mEq Oral Given 11/25/20 1759)  ketorolac (TORADOL) 30 MG/ML injection 15 mg (15 mg Intravenous Given 11/25/20 1756)  morphine 4 MG/ML injection 4 mg (4 mg Intravenous Given 11/25/20 1949)  ondansetron (ZOFRAN) injection 4 mg (4 mg Intravenous Given 11/25/20 1950)    Pertinent labs & imaging results that were available during my care of the patient were reviewed by me and considered in my medical decision making (see chart for details).  Kashauna Swanick was evaluated in Emergency Department on 11/25/2020 for the symptoms described in the history of present illness. She was evaluated in the context of the global COVID-19 pandemic, which necessitated consideration that the patient might be at risk for infection with the SARS-CoV-2 virus that causes COVID-19. Institutional protocols and algorithms that pertain to the evaluation of patients at risk for COVID-19 are in a state of rapid change based on information released by regulatory bodies including the CDC and federal and state organizations. These policies and algorithms were followed during the patient's care in the ED.   Patient presents with symptoms of UTI.  Exam is  overall benign, reassuring.  She is nontoxic.  Vitals showed mild tachycardia.  No fever.  Labs show a clear nitrite positive UTI on urinalysis, mild leukocytosis, marked hyperglycemia with a serum glucose of 750.  Rest of her chemistry panel is unremarkable, negative ketones in the urine, no metabolic acidosis on the chemistry panel.  Will give IV fluid bolus for hydration, insulin aspart bolus, oral potassium supplementation, IV ketorolac.  when glucose becomes adequately controlled, I will start her on metformin and antibiotics and recommend follow-up with her PCP at [facility] clinic.  Clinical Course as of 11/25/20 1954  Nancy Fetter Nov 25, 2020  1936 CT finding of ureteral obstruction discussed with urology Dr. Jeffie Pollock who will come evaluate patient.  Who recommends hospitalization overnight for observation as she may require urgent cystoscopy and ureteral stenting if condition  worsens.  Patient is agreeable.  Repeat blood sugar is 282.  Vitals remained stable, heart rate is 100.  Blood pressure normal.  Flank pain persist so I will give the patient 4 mg IV morphine. She will remain NPO, will start LR maintenance infusion. [PS]    Clinical Course User Index [PS] Sharman Cheek, MD     ____________________________________________   FINAL CLINICAL IMPRESSION(S) / ED DIAGNOSES    Final diagnoses:  Cystitis  Diabetes mellitus, new onset [facility])  Ureteral obstruction, right     ED Discharge Orders     None       Portions of this note were generated with dragon dictation software. Dictation errors may occur despite best attempts at proofreading.    Sharman Cheek, MD 11/25/20 [phone]

## 2020-11-25 NOTE — Anesthesia Preprocedure Evaluation (Addendum)
Anesthesia Evaluation  Patient identified by MRN, date of birth, ID band Patient awake    Reviewed: NPO status , Patient's Chart, lab work & pertinent test results  History of Anesthesia Complications Negative for: history of anesthetic complications  Airway Mallampati: II       Dental  (+) Chipped, Poor Dentition,    Pulmonary Current Smoker,    Pulmonary exam normal breath sounds clear to auscultation       Cardiovascular negative cardio ROS Normal cardiovascular exam Rhythm:Regular Rate:Normal     Neuro/Psych PSYCHIATRIC DISORDERS Depression Tardive dyskinesia from antidepressants     GI/Hepatic negative GI ROS, Neg liver ROS,   Endo/Other  diabetes, Poorly Controlled, Type 2  Renal/GU negative Renal ROS     Musculoskeletal negative musculoskeletal ROS (+)   Abdominal   Peds  Hematology   Anesthesia Other Findings   Reproductive/Obstetrics negative OB ROS                           Anesthesia Physical Anesthesia Plan  ASA: 2 and emergent  Anesthesia Plan: General   Post-op Pain Management:    Induction: Intravenous  PONV Risk Score and Plan: 2 and Ondansetron, Midazolam and Diphenhydramine  Airway Management Planned: LMA  Additional Equipment:   Intra-op Plan:   Post-operative Plan: Extubation in OR  Informed Consent: I have reviewed the patients History and Physical, chart, labs and discussed the procedure including the risks, benefits and alternatives for the proposed anesthesia with the patient or authorized representative who has indicated his/her understanding and acceptance.     Dental advisory given  Plan Discussed with:   Anesthesia Plan Comments:        Anesthesia Quick Evaluation

## 2020-11-26 ENCOUNTER — Encounter: Payer: Self-pay | Admitting: Urology

## 2020-11-26 DIAGNOSIS — A419 Sepsis, unspecified organism: Secondary | ICD-10-CM

## 2020-11-26 DIAGNOSIS — E119 Type 2 diabetes mellitus without complications: Secondary | ICD-10-CM

## 2020-11-26 DIAGNOSIS — N136 Pyonephrosis: Principal | ICD-10-CM

## 2020-11-26 LAB — CBC
HCT: 37.4 % (ref 36.0–46.0)
Hemoglobin: 12.1 g/dL (ref 12.0–15.0)
MCH: 31.1 pg (ref 26.0–34.0)
MCHC: 32.4 g/dL (ref 30.0–36.0)
MCV: 96.1 fL (ref 80.0–100.0)
Platelets: 214 10*3/uL (ref 150–400)
RBC: 3.89 MIL/uL (ref 3.87–5.11)
RDW: 12.1 % (ref 11.5–15.5)
WBC: 12.7 10*3/uL — ABNORMAL HIGH (ref 4.0–10.5)
nRBC: 0 % (ref 0.0–0.2)

## 2020-11-26 LAB — BASIC METABOLIC PANEL
Anion gap: 9 (ref 5–15)
BUN: 16 mg/dL (ref 6–20)
CO2: 26 mmol/L (ref 22–32)
Calcium: 8.3 mg/dL — ABNORMAL LOW (ref 8.9–10.3)
Chloride: 95 mmol/L — ABNORMAL LOW (ref 98–111)
Creatinine, Ser: 0.68 mg/dL (ref 0.44–1.00)
GFR, Estimated: >60 mL/min (ref >60)
Glucose, Bld: 604 mg/dL (ref 70–99)
Potassium: 5.1 mmol/L (ref 3.5–5.1)
Sodium: 130 mmol/L — ABNORMAL LOW (ref 135–145)

## 2020-11-26 LAB — GLUCOSE, CAPILLARY
Glucose-Capillary: 469 mg/dL — ABNORMAL HIGH (ref 70–99)
Glucose-Capillary: 313 mg/dL — ABNORMAL HIGH (ref 70–99)
Glucose-Capillary: 141 mg/dL — ABNORMAL HIGH (ref 70–99)
Glucose-Capillary: 131 mg/dL — ABNORMAL HIGH (ref 70–99)

## 2020-11-26 LAB — PROTIME-INR
INR: 1 (ref 0.8–1.2)
Prothrombin Time: 13.3 seconds (ref 11.4–15.2)

## 2020-11-26 LAB — CORTISOL-AM, BLOOD: Cortisol - AM: 2.4 ug/dL — ABNORMAL LOW (ref 6.7–22.6)

## 2020-11-26 LAB — PROCALCITONIN: Procalcitonin: 0.15 ng/mL

## 2020-11-26 LAB — HIV ANTIBODY (ROUTINE TESTING W REFLEX): HIV Screen 4th Generation wRfx: NONREACTIVE

## 2020-11-26 MED ORDER — INSULIN ASPART 100 UNIT/ML IJ SOLN: 0.0000 [IU] | Freq: Three times a day (TID) | INTRAMUSCULAR | Status: DC

## 2020-11-26 MED ORDER — LIVING WELL WITH DIABETES BOOK
Freq: Once | Status: AC
  Filled 2020-11-26: qty 1

## 2020-11-26 MED ORDER — INSULIN STARTER KIT- PEN NEEDLES (ENGLISH)
1.0000 | Freq: Once | Status: AC
Start: 2020-11-26 — End: 2020-11-26
  Administered 2020-11-26: 1
  Filled 2020-11-26: qty 1

## 2020-11-26 MED ORDER — GABAPENTIN 300 MG PO CAPS
300.0000 mg | ORAL_CAPSULE | Freq: Two times a day (BID) | ORAL | Status: DC
  Administered 2020-11-26 – 2020-11-27 (×2): 300 mg via ORAL
  Filled 2020-11-26 (×2): qty 1

## 2020-11-26 MED ORDER — VALBENAZINE TOSYLATE 40 MG PO CAPS
80.0000 mg | ORAL_CAPSULE | Freq: Every day | ORAL | Status: DC
  Administered 2020-11-26: 80 mg via ORAL
  Filled 2020-11-26 (×2): qty 2

## 2020-11-26 MED ORDER — ENSURE MAX PROTEIN PO LIQD
11.0000 [oz_av] | Freq: Two times a day (BID) | ORAL | Status: DC
  Administered 2020-11-26: 11 [oz_av] via ORAL
  Filled 2020-11-26: qty 330

## 2020-11-26 MED ORDER — LURASIDONE HCL 80 MG PO TABS
120.0000 mg | ORAL_TABLET | Freq: Every day | ORAL | Status: DC
  Filled 2020-11-26 (×2): qty 1

## 2020-11-26 MED ORDER — ATOMOXETINE HCL 60 MG PO CAPS
80.0000 mg | ORAL_CAPSULE | Freq: Every day | ORAL | Status: DC
  Filled 2020-11-26 (×2): qty 2

## 2020-11-26 MED ORDER — CHLORHEXIDINE GLUCONATE CLOTH 2 % EX PADS: 6.0000 | MEDICATED_PAD | Freq: Every day | CUTANEOUS | Status: DC

## 2020-11-26 MED ORDER — DONEPEZIL HCL 5 MG PO TABS
5.0000 mg | ORAL_TABLET | Freq: Every day | ORAL | Status: DC
  Administered 2020-11-26: 5 mg via ORAL
  Filled 2020-11-26 (×2): qty 1

## 2020-11-26 MED ORDER — INSULIN ASPART 100 UNIT/ML IJ SOLN
4.0000 [IU] | Freq: Three times a day (TID) | INTRAMUSCULAR | Status: DC
  Administered 2020-11-26 – 2020-11-27 (×4): 4 [IU] via SUBCUTANEOUS
  Filled 2020-11-26 (×3): qty 1

## 2020-11-26 MED ORDER — LURASIDONE HCL 80 MG PO TABS
120.0000 mg | ORAL_TABLET | Freq: Every day | ORAL | Status: DC
  Filled 2020-11-26 (×3): qty 1

## 2020-11-26 MED ORDER — INSULIN ASPART 100 UNIT/ML IJ SOLN: 0.0000 [IU] | Freq: Every day | INTRAMUSCULAR | Status: DC

## 2020-11-26 MED ORDER — LAMOTRIGINE 100 MG PO TABS
200.0000 mg | ORAL_TABLET | Freq: Every day | ORAL | Status: DC
  Administered 2020-11-27: 200 mg via ORAL
  Filled 2020-11-26: qty 2

## 2020-11-26 MED ORDER — OXYBUTYNIN CHLORIDE 5 MG PO TABS: 5.0000 mg | ORAL_TABLET | Freq: Three times a day (TID) | ORAL | Status: DC | PRN

## 2020-11-26 MED ORDER — TRAZODONE HCL 100 MG PO TABS: 100.0000 mg | ORAL_TABLET | Freq: Every day | ORAL | Status: DC

## 2020-11-26 MED ORDER — TAMSULOSIN HCL 0.4 MG PO CAPS
0.4000 mg | ORAL_CAPSULE | Freq: Every day | ORAL | Status: DC
  Administered 2020-11-26 – 2020-11-27 (×2): 0.4 mg via ORAL
  Filled 2020-11-26 (×2): qty 1

## 2020-11-26 MED ORDER — INSULIN GLARGINE-YFGN 100 UNIT/ML ~~LOC~~ SOLN
15.0000 [IU] | Freq: Every day | SUBCUTANEOUS | Status: DC
  Administered 2020-11-26: 15 [IU] via SUBCUTANEOUS
  Filled 2020-11-26: qty 0.15

## 2020-11-26 MED ORDER — INSULIN GLARGINE-YFGN 100 UNIT/ML ~~LOC~~ SOLN
22.0000 [IU] | Freq: Every day | SUBCUTANEOUS | Status: DC
  Filled 2020-11-26: qty 0.22

## 2020-11-26 MED ORDER — INSULIN ASPART 100 UNIT/ML IJ SOLN
0.0000 [IU] | Freq: Three times a day (TID) | INTRAMUSCULAR | Status: DC
  Administered 2020-11-26: 9 [IU] via SUBCUTANEOUS
  Administered 2020-11-26: 1 [IU] via SUBCUTANEOUS
  Administered 2020-11-26: 7 [IU] via SUBCUTANEOUS
  Administered 2020-11-27: 5 [IU] via SUBCUTANEOUS
  Filled 2020-11-26 (×4): qty 1

## 2020-11-26 MED ORDER — OXYCODONE HCL 5 MG PO TABS: 5.0000 mg | ORAL_TABLET | Freq: Four times a day (QID) | ORAL | Status: DC | PRN

## 2020-11-26 MED ORDER — ADULT MULTIVITAMIN W/MINERALS CH
1.0000 | ORAL_TABLET | Freq: Every day | ORAL | Status: DC
Start: 2020-11-27 — End: 2020-11-27
  Administered 2020-11-27: 1 via ORAL
  Filled 2020-11-26: qty 1

## 2020-11-26 MED ORDER — KETOROLAC TROMETHAMINE 15 MG/ML IJ SOLN
15.0000 mg | Freq: Three times a day (TID) | INTRAMUSCULAR | Status: DC | PRN
  Administered 2020-11-26: 15 mg via INTRAVENOUS
  Filled 2020-11-26: qty 1

## 2020-11-26 NOTE — Progress Notes (Signed)
Urology Inpatient Progress Note  Subjective: Patient is afebrile, VSS.  Significant hyperglycemia overnight.  Diabetes coordinator is involved with her care. WBC count down today, 12.7.  Creatinine down today, 0.68.  Urine culture pending, on antibiotics as below. Foley catheter in place draining clear yellow urine. She reports some right-sided flank pain this morning.  Anti-infectives: Anti-infectives (From admission, onward)    Start     Dose/Rate Route Frequency Ordered Stop   11/26/20 1700  cefTRIAXone (ROCEPHIN) 2 g in sodium chloride 0.9 % 100 mL IVPB        2 g 200 mL/hr over 30 Minutes Intravenous Every 24 hours 11/25/20 2031 12/03/20 1659   11/25/20 1700  cefTRIAXone (ROCEPHIN) 1 g in sodium chloride 0.9 % 100 mL IVPB        1 g 200 mL/hr over 30 Minutes Intravenous  Once 11/25/20 1648 11/25/20 1830       Current Facility-Administered Medications  Medication Dose Route Frequency Provider Last Rate Last Admin   0.9 % NaCl with KCl 20 mEq/ L  infusion   Intravenous Continuous Mansy, Jan A, MD 100 mL/hr at 11/25/20 2345 Infusion Verify at 11/25/20 2345   acetaminophen (TYLENOL) tablet 650 mg  650 mg Oral Q6H PRN Mansy, Jan A, MD       Or   acetaminophen (TYLENOL) suppository 650 mg  650 mg Rectal Q6H PRN Mansy, Jan A, MD       cefTRIAXone (ROCEPHIN) 2 g in sodium chloride 0.9 % 100 mL IVPB  2 g Intravenous Q24H Mansy, Jan A, MD       Chlorhexidine Gluconate Cloth 2 % PADS 6 each  6 each Topical Daily Wieting, Richard, MD       enoxaparin (LOVENOX) injection 40 mg  40 mg Subcutaneous Q24H Mansy, Jan A, MD       gabapentin (NEURONTIN) capsule 300 mg  300 mg Oral TID Mansy, Jan A, MD       insulin aspart (novoLOG) injection 0-5 Units  0-5 Units Subcutaneous QHS Wieting, Richard, MD       insulin aspart (novoLOG) injection 0-9 Units  0-9 Units Subcutaneous TID WC Wieting, Richard, MD       insulin aspart (novoLOG) injection 4 Units  4 Units Subcutaneous TID WC Wieting, Richard, MD        insulin glargine-yfgn [facility]) injection 15 Units  15 Units Subcutaneous Daily Loletha Grayer, MD       lactated ringers infusion   Intravenous Continuous Carrie Mew, MD 125 mL/hr at 11/25/20 [place] at 11/25/20 2133   lamoTRIgine (LAMICTAL) tablet 100 mg  100 mg Oral Daily Mansy, Jan A, MD       living well with diabetes book MISC   Does not apply Once Loletha Grayer, MD       magnesium hydroxide (MILK OF MAGNESIA) suspension 30 mL  30 mL Oral Daily PRN Mansy, Jan A, MD       ondansetron [facility]) tablet 4 mg  4 mg Oral Q6H PRN Mansy, Jan A, MD       Or   ondansetron [facility]) injection 4 mg  4 mg Intravenous Q6H PRN Mansy, Jan A, MD       pneumococcal 23 valent vaccine (PNEUMOVAX-23) injection 0.5 mL  0.5 mL Intramuscular Tomorrow-1000 Mansy, Jan A, MD       traZODone (DESYREL) tablet 50 mg  50 mg Oral QHS Mansy, Jan A, MD       traZODone (DESYREL) tablet 50 mg  50 mg Oral QHS PRN Mansy, Vernetta Honey, MD       Objective: Vital signs in last 24 hours: Temp:  [97.8 F (36.6 C)-98.1 F (36.7 C)] 98 F (36.7 C) (11/21 0801) Pulse Rate:  [84-106] 84 (11/21 0801) Resp:  [12-20] 16 (11/21 0423) BP: (117-167)/(60-82) 141/80 (11/21 0801) SpO2:  [92 %-100 %] 98 % (11/21 0801) Weight:  [67 kg] 67 kg (11/20 1323)  Intake/Output from previous day: 11/20 0701 - 11/21 0700 In: 1753.8 [I.V.:553.8; IV Piggyback:1200] Out: 20 [Urine:20] Intake/Output this shift: No intake/output data recorded.  Physical Exam Vitals and nursing note reviewed.  Constitutional:      General: She is not in acute distress.    Appearance: She is not ill-appearing, toxic-appearing or diaphoretic.  HENT:     Head: Normocephalic and atraumatic.  Pulmonary:     Effort: Pulmonary effort is normal. No respiratory distress.  Skin:    General: Skin is warm and dry.  Neurological:     Mental Status: She is alert and oriented to person, place, and time.  Psychiatric:        Mood and Affect: Mood normal.         Behavior: Behavior normal.   Lab Results:  Recent Labs    11/25/20 1346 11/26/20 0636  WBC 14.7* 12.7*  HGB 14.3 12.1  HCT 44.0 37.4  PLT 253 214   BMET Recent Labs    11/25/20 1346 11/26/20 0636  NA 127* 130*  K 3.5 5.1  CL 88* 95*  CO2 26 26  GLUCOSE 748* 604*  BUN 11 16  CREATININE 0.85 0.68  CALCIUM 9.2 8.3*   PT/INR Recent Labs    11/26/20 0636  LABPROT 13.3  INR 1.0   Assessment & Plan: 55 year old female admitted with right pyonephrosis and sepsis now s/p right ureteral stent placement with Dr. Annabell Howells.  She is clinically improving this morning with mild stent symptoms.  No evidence of ureteral stone on ureteroscopy.  Okay for an office stent removal in 7 to 10 days after completion of culture appropriate antibiotics.  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  We discussed normal stent symptoms today including flank pain, urgency, frequency, urge incontinence, dysuria, and gross hematuria.  Recommendations: -Continue empiric antibiotics and follow cultures.  She will require a total of 14 days of culture appropriate therapy. -Flomax 0.4 mg daily and oxybutynin 5 mg every 8 hours as needed for stent symptoms. -Discontinue Foley catheter -Outpatient cystoscopy stent removal in 7 to 10 days  Carman Ching, PA-C 11/26/2020

## 2020-11-26 NOTE — Progress Notes (Signed)
Patient ID: Tina Cisneros, female   DOB: April 21, 1965, 55 y.o.   MRN: [id number] [facility] PROGRESS NOTE  Tina Cisneros [id number] DOB: [birth year] DOA: 11/25/2020 PCP: [facility]  HPI/Subjective: Patient came in with abdominal pain and had a stent placed by urology.  He was also found to be newly diabetic with a sugar of 748.  Objective: Vitals:   11/26/20 0423 11/26/20 0801  BP: 129/76 (!) 141/80  Pulse: 85 84  Resp: 16   Temp: 98.1 F (36.7 C) 98 F (36.7 C)  SpO2: 96% 98%    Intake/Output Summary (Last 24 hours) at 11/26/2020 1353 Last data filed at 11/26/2020 0916 Gross per 24 hour  Intake 2705.52 ml  Output 870 ml  Net 1835.52 ml   Filed Weights   11/25/20 1323  Weight: 67 kg    ROS: Review of Systems  Respiratory:  Negative for shortness of breath.   Cardiovascular:  Negative for chest pain.  Gastrointestinal:  Negative for abdominal pain, nausea and vomiting.  Exam: Physical Exam HENT:     Head: Normocephalic.     Mouth/Throat:     Pharynx: No oropharyngeal exudate.  Eyes:     General: Lids are normal.     Conjunctiva/sclera: Conjunctivae normal.  Cardiovascular:     Rate and Rhythm: Normal rate and regular rhythm.     Heart sounds: Normal heart sounds, S1 normal and S2 normal.  Pulmonary:     Breath sounds: Normal breath sounds. No decreased breath sounds, wheezing, rhonchi or rales.  Abdominal:     Palpations: Abdomen is soft.     Tenderness: There is no abdominal tenderness.  Musculoskeletal:     Right lower leg: No swelling.     Left lower leg: No swelling.  Skin:    General: Skin is warm.     Findings: No rash.  Neurological:     Mental Status: She is alert and oriented to person, place, and time.     Comments: Mouth tremor      Scheduled Meds:  [START ON 11/27/2020] atomoxetine  80 mg Oral Daily   donepezil  5 mg Oral QHS   enoxaparin (LOVENOX) injection  40 mg Subcutaneous Q24H   gabapentin  300 mg Oral  BID   insulin aspart  0-5 Units Subcutaneous QHS   insulin aspart  0-9 Units Subcutaneous TID WC   insulin aspart  4 Units Subcutaneous TID WC   insulin glargine-yfgn  15 Units Subcutaneous Daily   [START ON 11/27/2020] lamoTRIgine  200 mg Oral Daily   lurasidone  120 mg Oral QHS   tamsulosin  0.4 mg Oral Daily   traZODone  100 mg Oral QHS   Continuous Infusions:  cefTRIAXone (ROCEPHIN)  IV      Assessment/Plan:  Type 2 diabetes mellitus with nonketotic hyperglycemia.  Patient was given IV fluid hydration and started on sliding scale insulin.  Hemoglobin A1c still pending.  Having nursing staff do diabetic teaching on how to inject insulin.  Prescribed 15 units of Semglee insulin this morning and will increase to 22 units for tomorrow.  Short acting insulin prior to meals.  We will check with transitional care team about medications at medication management.  Patient will need a glucometer.  I discussed diet and exercise with patient.  Diabetes coordinator to also follow. Acute cystitis with hematuria on Rocephin.  Follow-up cultures.  Sepsis ruled out Right hydronephrosis.  Stent placed by urology.  No stone seen.  Will need stent removal  in 7 to 10 days.  Flomax daily and oxybutynin as needed. Bipolar disorder and tardive dyskinesia.  Continue psychiatric medications     Code Status:     Code Status Orders  (From admission, onward)           Start     Ordered   11/25/20 2029  Full code  Continuous        11/25/20 2031           Code Status History     This patient has a current code status but no historical code status.      Family Communication: Voicemail box is full for husband Disposition Plan: Status is: Inpatient  Antibiotics: Rocephin  Time spent: 28 minutes  Tina Cisneros [employer]

## 2020-11-26 NOTE — Progress Notes (Signed)
Patient was able to draw up insulin and give own injection

## 2020-11-26 NOTE — Anesthesia Postprocedure Evaluation (Signed)
Anesthesia Post Note  Patient: Tina Cisneros  Procedure(s) Performed: CYSTOSCOPY WITH STENT PLACEMENT (Right)  Patient location during evaluation: PACU Anesthesia Type: General Level of consciousness: awake and alert Pain management: pain level controlled Vital Signs Assessment: post-procedure vital signs reviewed and stable Respiratory status: spontaneous breathing, nonlabored ventilation, respiratory function stable and patient connected to nasal cannula oxygen Cardiovascular status: blood pressure returned to baseline and stable Postop Assessment: no apparent nausea or vomiting Anesthetic complications: no   No notable events documented.   Last Vitals:  Vitals:   11/25/20 2240 11/25/20 2253  BP: 128/73 120/68  Pulse: 94 90  Resp: 14 15  Temp: 36.6 C 36.7 C  SpO2: 95% 94%    Last Pain:  Vitals:   11/25/20 2253  TempSrc: Oral  PainSc:                  Tina Cisneros

## 2020-11-26 NOTE — Progress Notes (Addendum)
Inpatient Diabetes Program Recommendations  AACE/ADA: New Consensus Statement on Inpatient Glycemic Control   Target Ranges:  Prepandial:   less than 140 mg/dL      Peak postprandial:   less than 180 mg/dL (1-2 hours)      Critically ill patients:  140 - 180 mg/dL     Latest Reference Range & Units 11/25/20 19:27 11/25/20 22:15  Glucose-Capillary 70 - 99 mg/dL 282 (H) 312 (H)    Latest Reference Range & Units 11/25/20 13:46  Glucose 70 - 99 mg/dL 748 (HH)  (HH): Data is critically high  Review of Glycemic Control  Diabetes history:No Outpatient Diabetes medications: NA Current orders for Inpatient glycemic control: Novolog 0-20 units AC&HS, Amaryl 2 mg daily  Inpatient Diabetes Program Recommendations:    HbgA1C:  A1C is still in process.  NOTE: Per chart review, not DM hx noted. Initial lab glucose 748 mg/dl and patient has been diagnosed with new onset DM this admission. Ordered Living Well with DM book, RD consult for diet education, and patient education by bedside nursing. Will plan to talk with patient today.  Addendum 11/26/20$RemoveBeforeDEI'@12'oIbuZpqYRsVRNYdP$ :55-Spoke with patient about new diabetes diagnosis. Patient states that she has no prior DM hx and that she last had blood work at [facility] less than 1 year ago. Patient states that she last went to [facility] about 2 months ago for headaches and she was prescribed Prednisone dose pack.  Explained that initial lab glucose 748 mg/dl on 11/25/20 indicating she has DM. Patient states that her daughter has DM (dx at 23 years old) and takes insulin (patient is not sure if DM1 or DM2).  Explained what an A1C is and informed patient that an A1C has been ordered but not resulted yet.  Discussed basic pathophysiology of DM, basic home care, importance of checking CBGs and maintaining good CBG control to prevent long-term and short-term complications. Reviewed glucose and A1C goals.  Reviewed signs and symptoms of hyperglycemia and hypoglycemia along with  treatment for both. Discussed impact of nutrition, exercise, stress, sickness, and medications on diabetes control. Explained that recent Prednisone, likely contributed to hyperglycemia.  Patient has Living Well with diabetes booklet at bedside; encouraged patient to read through entire book. Informed patient that she will likely be prescribed insulin given that glucose was back up to 604 mg/dl this morning. Informed patient that she received Decadron 4 mg x1 on 11/25/20 which contributed to glucose going back up this morning. Patient states that her daughter uses insulin pens and she has seen her give herself injections. Patient states she feels that she would prefer insulin pens if prescribed insulin at discharge.  Explained how the doctor he follows up with can use the log book to continue to make insulin adjustments if needed. Educated patient on insulin pen use at home. Reviewed contents of insulin flexpen starter kit. Reviewed all steps of insulin pen including attachment of needle, 2-unit air shot, dialing up dose, giving injection, removing needle, disposal of sharps, storage of unused insulin, disposal of insulin etc. Patient able to provide successful return demonstration. Informed patient that RN will be asking her to self-administer insulin to ensure proper technique and ability to administer self insulin shots. Encouraged patient to check glucose as directed at discharge, to keep a log of glucose readings, to take DM medications as prescribed, and be sure to follow up with [facility] as directed at discharge.   Patient verbalized understanding of information discussed and she states that she has no  further questions at this time related to diabetes.   RNs to provide ongoing basic DM education at bedside with this patient and engage patient to actively check blood glucose and administer insulin injections. Ordered TOC consult for medication assistance and to provide glucose monitoring kit if available.  [facility]ed [facility] and verified they have Basaglar and Humalog insulin pens.  At time of discharge, if patient will be discharged on insulin please provide Rx for [facility] [phone]) and/or Humalog Kwikpens [phone]), and insulin pen needles [phone]).  Thanks, Barnie Alderman, RN, MSN, CDE Diabetes Coordinator Inpatient Diabetes Program [phone] (Team Pager from 8am to 5pm)

## 2020-11-26 NOTE — Progress Notes (Signed)
Lab called with critical glucose of 604. CBG was 469. Dr Renae Gloss notified. Orders received

## 2020-11-26 NOTE — Progress Notes (Signed)
Initial Nutrition Assessment  DOCUMENTATION CODES:   Not applicable  INTERVENTION:   Ensure Max protein supplement BID, each supplement provides 150kcal and 30g of protein.  MVI po daily   Diabetes diet education   NUTRITION DIAGNOSIS:   Inadequate oral intake related to acute illness as evidenced by per patient/family report.  GOAL:   Patient will meet greater than or equal to 90% of their needs  MONITOR:   PO intake, Supplement acceptance, Labs, Weight trends, Skin, I & O's  REASON FOR ASSESSMENT:   Consult Diet education  ASSESSMENT:   55 y/o female with h/o bipolar depression and anxiety who is admitted with hydronephrosis s/p stent 11/20, UTI, sepsis and new DM.  Met with pt in room today. Pt reports fair appetite and oral intake pta and in hospital; pt documented to have eaten only sips/bites of her breakfast this morning but ate 90% of her lunch. RD discussed with pt the importance of adequate nutrition needed to preserve lean muscle. Pt is willing to drink chocolate supplements in hospital. RD will add supplements and MVI to help pt meet her estimated needs. Per chart, pt appears weight stable pta. RD provided pt with diabetes diet education today.   RD provided "Nutrition and Type II Diabetes" handout from the Academy of [employer] and Dietetics. Discussed different food groups and their effects on blood sugar, emphasizing carbohydrate-containing foods. Provided list of carbohydrates and recommended serving sizes of common foods.  Discussed importance of controlled and consistent carbohydrate intake throughout the day. Provided examples of ways to balance meals/snacks and encouraged intake of high-fiber, whole grain complex carbohydrates. Teach back method used.  Medications reviewed and include: lovenox, insulin, ceftriaxone  Labs reviewed: Na 130(L), K 5.1 wnl Wbc- 12.7 Cbgs- 313, 469 x 24 hrs  NUTRITION - FOCUSED PHYSICAL EXAM:  Flowsheet Row Most Recent  Value  Orbital Region No depletion  Upper Arm Region Mild depletion  Thoracic and Lumbar Region No depletion  Buccal Region No depletion  Temple Region Mild depletion  Clavicle Bone Region Mild depletion  Clavicle and Acromion Bone Region Mild depletion  Scapular Bone Region Mild depletion  Dorsal Hand No depletion  Patellar Region Moderate depletion  Anterior Thigh Region Moderate depletion  Posterior Calf Region Moderate depletion  Edema (RD Assessment) None  Hair Reviewed  Eyes Reviewed  Mouth Reviewed  Skin Reviewed  Nails Reviewed   Diet Order:   Diet Order             Diet Carb Modified Fluid consistency: Thin; Room service appropriate? Yes  Diet effective now                  EDUCATION NEEDS:   Education needs have been addressed  Skin:  Skin Assessment: Reviewed RN Assessment (Incision vagina)  Last BM:  PTA  Height:   Ht Readings from Last 1 Encounters:  11/25/20 _0  (1.651 m)    Weight:   Wt Readings from Last 1 Encounters:  11/25/20 67 kg    Ideal Body Weight:  56.8 kg  BMI:  Body mass index is 24.58 kg/m.  Estimated Nutritional Needs:   Kcal:  1600-1800kcal/day  Protein:  80-90g/day  Fluid:  1.7-2.0L/day  Koleen Distance MS, RD, LDN Please refer to [facility] for RD and/or RD on-call/weekend/after hours pager

## 2020-11-27 ENCOUNTER — Other Ambulatory Visit: Payer: Self-pay

## 2020-11-27 DIAGNOSIS — G2401 Drug induced subacute dyskinesia: Secondary | ICD-10-CM

## 2020-11-27 DIAGNOSIS — N1330 Unspecified hydronephrosis: Secondary | ICD-10-CM

## 2020-11-27 DIAGNOSIS — E1100 Type 2 diabetes mellitus with hyperosmolarity without nonketotic hyperglycemic-hyperosmolar coma (NKHHC): Secondary | ICD-10-CM

## 2020-11-27 DIAGNOSIS — F319 Bipolar disorder, unspecified: Secondary | ICD-10-CM

## 2020-11-27 LAB — BASIC METABOLIC PANEL
Anion gap: 6 (ref 5–15)
BUN: 16 mg/dL (ref 6–20)
CO2: 28 mmol/L (ref 22–32)
Calcium: 8.4 mg/dL — ABNORMAL LOW (ref 8.9–10.3)
Chloride: 101 mmol/L (ref 98–111)
Creatinine, Ser: 0.58 mg/dL (ref 0.44–1.00)
GFR, Estimated: >60 mL/min (ref >60)
Glucose, Bld: 309 mg/dL — ABNORMAL HIGH (ref 70–99)
Potassium: 3.6 mmol/L (ref 3.5–5.1)
Sodium: 135 mmol/L (ref 135–145)

## 2020-11-27 LAB — URINE CULTURE: Culture: >=100000 — AB

## 2020-11-27 LAB — GLUCOSE, CAPILLARY: Glucose-Capillary: 252 mg/dL — ABNORMAL HIGH (ref 70–99)

## 2020-11-27 LAB — HEMOGLOBIN A1C
Hgb A1c MFr Bld: 14.5 % — ABNORMAL HIGH (ref 4.8–5.6)
Mean Plasma Glucose: 369 mg/dL

## 2020-11-27 MED ORDER — CEPHALEXIN 500 MG PO CAPS
500.0000 mg | ORAL_CAPSULE | Freq: Three times a day (TID) | ORAL | 0 refills | Status: AC
  Filled 2020-11-27: qty 30, 10d supply, fill #0

## 2020-11-27 MED ORDER — INSULIN GLARGINE-YFGN 100 UNIT/ML ~~LOC~~ SOLN
20.0000 [IU] | Freq: Every day | SUBCUTANEOUS | Status: DC
  Administered 2020-11-27: 20 [IU] via SUBCUTANEOUS
  Filled 2020-11-27: qty 0.2

## 2020-11-27 MED ORDER — INSULIN GLARGINE-YFGN 100 UNIT/ML ~~LOC~~ SOLN
15.0000 [IU] | Freq: Every day | SUBCUTANEOUS | Status: DC
  Filled 2020-11-27: qty 0.15

## 2020-11-27 MED ORDER — TAMSULOSIN HCL 0.4 MG PO CAPS
0.4000 mg | ORAL_CAPSULE | Freq: Every day | ORAL | 0 refills | Status: AC
  Filled 2020-11-27: qty 30, 30d supply, fill #0

## 2020-11-27 MED ORDER — OXYBUTYNIN CHLORIDE 5 MG PO TABS
5.0000 mg | ORAL_TABLET | Freq: Three times a day (TID) | ORAL | 0 refills | Status: AC | PRN
  Filled 2020-11-27: qty 20, 7d supply, fill #0

## 2020-11-27 MED ORDER — ATOMOXETINE HCL 40 MG PO CAPS
80.0000 mg | ORAL_CAPSULE | Freq: Every day | ORAL | Status: DC
  Administered 2020-11-27: 80 mg via ORAL
  Filled 2020-11-27: qty 2

## 2020-11-27 MED ORDER — INSULIN PEN NEEDLE 32G X 4 MM MISC
1.0000 | Freq: Three times a day (TID) | 0 refills | Status: AC
  Filled 2020-11-27: qty 100, 25d supply, fill #0

## 2020-11-27 MED ORDER — INSULIN LISPRO (1 UNIT DIAL) 100 UNIT/ML (KWIKPEN)
4.0000 [IU] | PEN_INJECTOR | Freq: Three times a day (TID) | SUBCUTANEOUS | 1 refills | Status: AC
  Filled 2020-11-27: qty 15, 125d supply, fill #0

## 2020-11-27 MED ORDER — BASAGLAR KWIKPEN 100 UNIT/ML ~~LOC~~ SOPN
20.0000 [IU] | PEN_INJECTOR | Freq: Every day | SUBCUTANEOUS | 1 refills | Status: AC
  Filled 2020-11-27: qty 15, 75d supply, fill #0

## 2020-11-27 NOTE — Progress Notes (Signed)
Inpatient Diabetes Program Recommendations  AACE/ADA: New Consensus Statement on Inpatient Glycemic Control   Target Ranges:  Prepandial:   less than 140 mg/dL      Peak postprandial:   less than 180 mg/dL (1-2 hours)      Critically ill patients:  140 - 180 mg/dL    Latest Reference Range & Units 11/27/20 02:38  Glucose 70 - 99 mg/dL 309 (H)    Latest Reference Range & Units 11/26/20 08:02 11/26/20 11:24 11/26/20 17:02 11/26/20 21:37  Glucose-Capillary 70 - 99 mg/dL 469 (H) 313 (H) 141 (H) 131 (H)    Latest Reference Range & Units 11/25/20 13:46  Glucose 70 - 99 mg/dL 748 (HH)  (H): Data is abnormally high  Review of Glycemic Control  Diabetes history:No Outpatient Diabetes medications: NA Current orders for Inpatient glycemic control: Semglee 15 units daily, Novolog 0-9 units TID with meals, Novolog 0-5 units QHS, Novolog 4 units TID with meals for meal coverage   Inpatient Diabetes Program Recommendations:     Insulin: Please consider increasing Semglee to 20 units daily (based on 67 kg x 0.3 units).  HbgA1C:  A1C is still in process.   Outpatient DM regimen: Have asked TOC to provide glucose monitoring kit.  Patient will need to get discharge medication from [facility] and they have Basaglar and Humalog insulin pens.  At time of discharge, if patient will be discharged on insulin please provide Rx for [facility] [phone]) and/or Humalog Kwikpens [phone]), and insulin pen needles [phone]).   Thanks, Barnie Alderman, RN, MSN, CDE Diabetes Coordinator Inpatient Diabetes Program [phone] (Team Pager from 8am to 5pm)

## 2020-11-27 NOTE — Discharge Summary (Signed)
[facility] NAME: Tina Cisneros    MR#:  [id number]  DATE OF BIRTH:  November 24, 1965  DATE OF ADMISSION:  11/25/2020 ADMITTING PHYSICIAN: Christel Mormon, MD  DATE OF DISCHARGE: 11/27/2020  1:30 PM  PRIMARY CARE PHYSICIAN: [facility]    ADMISSION DIAGNOSIS:  UTI (urinary tract infection) [N39.0] Ureteral obstruction, right [N13.5] Diabetes mellitus, new onset ([place]) [E11.9] Cystitis [N30.90]  DISCHARGE DIAGNOSIS:  Principal Problem:   UTI (urinary tract infection)   SECONDARY DIAGNOSIS:   Past Medical History:  Diagnosis Date   Depression     HOSPITAL COURSE:   Type 2 diabetes mellitus with nonketotic hyperglycemia.  Patient initially was given IV fluid hydration and started on insulin sliding scale.  Hemoglobin A1c elevated at 14.5.  Patient was initially started on long-acting insulin and short acting insulin.  I increased Basaglar insulin to 20 units daily upon discharge.  Also prescribed short acting insulin prior to meals 4 units 3 times daily.  Diet and exercise discussed at length with the patient.  Patient was taught by nursing staff how to inject insulin.  Diabetes coordinator and dietitian spoke about diet also with the patient.  Glucometer prescribed Acute cystitis with hematuria.  The patient was given Rocephin here.  E. coli growing out of urine cultures.  Antibiotic switched over to Keflex upon discharge. Right hydronephrosis.  Patient had a stent placed by urology.  No stone seen on cystoscopy.  Patient will need stent removal in 7 to 10 days.  Flomax and oxybutynin as needed prescribed Bipolar disorder and tardive dyskinesia.  Continue psychiatric medications  DISCHARGE CONDITIONS:   Satisfactory  CONSULTS OBTAINED:  Treatment Team:  Irine Seal, MD  DRUG ALLERGIES:  No Known Allergies  DISCHARGE MEDICATIONS:   Allergies as of 11/27/2020   No Known Allergies      Medication List      TAKE these medications    atomoxetine 80 MG capsule Commonly known as: STRATTERA Take 80 mg by mouth daily.   Basaglar KwikPen 100 UNIT/ML Inject 20 Units into the skin once daily. Start taking on: November 28, 2020   cephALEXin 500 MG capsule Commonly known as: KEFLEX Take 1 capsule (500 mg total) by mouth 3 (three) times daily for 10 days.   Comfort EZ Pen Needles 32G X 4 MM Misc Generic drug: Insulin Pen Needle USE AS DIRECTED WITH INSULIN PENS.   donepezil 5 MG tablet Commonly known as: ARICEPT Take 5 mg by mouth at bedtime.   gabapentin 300 MG capsule Commonly known as: NEURONTIN Take 300 mg by mouth 2 (two) times daily.   HumaLOG KwikPen 100 UNIT/ML KwikPen Generic drug: insulin lispro Inject 4 Units into the skin 3 (three) times daily.   hydrOXYzine 25 MG capsule Commonly known as: VISTARIL Take 25 mg by mouth 3 (three) times daily as needed for anxiety.   Ingrezza 80 MG capsule Generic drug: valbenazine Take 80 mg by mouth at bedtime.   lamoTRIgine 200 MG tablet Commonly known as: LAMICTAL Take 200 mg by mouth daily.   Lurasidone HCl 120 MG Tabs Take 120 mg by mouth daily with breakfast.   oxybutynin 5 MG tablet Commonly known as: DITROPAN Take 1 tablet (5 mg total) by mouth once every 8 (eight) hours as needed for bladder spasms.   tamsulosin 0.4 MG Caps capsule Commonly known as: FLOMAX Take 1 capsule (0.4 mg total) by mouth once daily.   traZODone 100 MG tablet  Commonly known as: DESYREL Take 100 mg by mouth at bedtime.               Durable Medical Equipment  (From admission, onward)           Start     Ordered   11/27/20 0830  DME Glucometer  Once        11/27/20 [id number]             DISCHARGE INSTRUCTIONS:   Follow-up PMD 5 days Follow-up urology 7 to 10 days for stent removal  If you experience worsening of your admission symptoms, develop shortness of breath, life threatening emergency, suicidal or homicidal  thoughts you must seek medical attention immediately by calling 911 or calling your MD immediately  if symptoms less severe.  You Must read complete instructions/literature along with all the possible adverse reactions/side effects for all the Medicines you take and that have been prescribed to you. Take any new Medicines after you have completely understood and accept all the possible adverse reactions/side effects.   Please note  You were cared for by a hospitalist during your hospital stay. If you have any questions about your discharge medications or the care you received while you were in the hospital after you are discharged, you can call the unit and asked to speak with the hospitalist on call if the hospitalist that took care of you is not available. Once you are discharged, your primary care physician will handle any further medical issues. Please note that NO REFILLS for any discharge medications will be authorized once you are discharged, as it is imperative that you return to your primary care physician (or establish a relationship with a primary care physician if you do not have one) for your aftercare needs so that they can reassess your need for medications and monitor your lab values.    Today   CHIEF COMPLAINT:   Chief Complaint  Patient presents with   Hematuria   Abdominal Pain    HISTORY OF PRESENT ILLNESS:  Tina Cisneros  is a 55 y.o. female came in with abdominal pain and hematuria   VITAL SIGNS:  Blood pressure (!) 155/97, pulse 88, temperature 98.3 F (36.8 C), resp. rate 18, height 5\' 5"  (1.651 m), weight 67 kg, last menstrual period 12/21/2014, SpO2 97 %.  I/O:   Intake/Output Summary (Last 24 hours) at 11/27/2020 1753 Last data filed at 11/26/2020 1900 Gross per 24 hour  Intake 220 ml  Output --  Net 220 ml    PHYSICAL EXAMINATION:  GENERAL:  55 y.o.-year-old patient lying in the bed with no acute distress.  EYES: Pupils equal, round, reactive to  light and accommodation. No scleral icterus.  HEENT: Head atraumatic, normocephalic. Oropharynx and nasopharynx clear.  LUNGS: Normal breath sounds bilaterally, no wheezing, rales,rhonchi or crepitation. No use of accessory muscles of respiration.  CARDIOVASCULAR: S1, S2 normal. No murmurs, rubs, or gallops.  ABDOMEN: Soft, non-tender, non-distended. Bowel sounds present. No organomegaly or mass.  EXTREMITIES: No pedal edema, cyanosis, or clubbing.  NEUROLOGIC: Cranial nerves II through XII are intact. Muscle strength 5/5 in all extremities. Sensation intact. Gait not checked.  PSYCHIATRIC: The patient is alert and answers all questions appropriately.  SKIN: No obvious rash, lesion, or ulcer.   DATA REVIEW:   CBC Recent Labs  Lab 11/26/20 0636  WBC 12.7*  HGB 12.1  HCT 37.4  PLT 214    Chemistries  Recent Labs  Lab 11/25/20 1346 11/26/20 0636 11/27/20  0238  NA 127*   < > 135  K 3.5   < > 3.6  CL 88*   < > 101  CO2 26   < > 28  GLUCOSE 748*   < > 309*  BUN 11   < > 16  CREATININE 0.85   < > 0.58  CALCIUM 9.2   < > 8.4*  AST 29  --   --   ALT 19  --   --   ALKPHOS 141*  --   --   BILITOT 0.6  --   --    < > = values in this interval not displayed.     Microbiology Results  Results for orders placed or performed during the hospital encounter of 11/25/20  Urine Culture     Status: Abnormal   Collection Time: 11/25/20  1:46 PM   Specimen: Urine, Clean Catch  Result Value Ref Range Status   Specimen Description   Final    URINE, CLEAN CATCH Performed at [facility], [address], [place], [place] [address]    Special Requests   Final    NONE Performed at [facility], [address] [place], [place] [address]    Culture >=100,000 COLONIES/mL ESCHERICHIA COLI (A)  Final   Report Status 11/27/2020 FINAL  Final   Organism ID, Bacteria ESCHERICHIA COLI (A)  Final      Susceptibility   Escherichia coli - MIC*    AMPICILLIN >=32 RESISTANT  Resistant     CEFAZOLIN <=4 SENSITIVE Sensitive     CEFEPIME <=0.12 SENSITIVE Sensitive     CEFTRIAXONE <=0.25 SENSITIVE Sensitive     CIPROFLOXACIN <=0.25 SENSITIVE Sensitive     GENTAMICIN <=1 SENSITIVE Sensitive     IMIPENEM <=0.25 SENSITIVE Sensitive     NITROFURANTOIN <=16 SENSITIVE Sensitive     TRIMETH/SULFA <=20 SENSITIVE Sensitive     AMPICILLIN/SULBACTAM >=32 RESISTANT Resistant     PIP/TAZO <=4 SENSITIVE Sensitive     * >=100,000 COLONIES/mL ESCHERICHIA COLI  Resp Panel by RT-PCR (Flu A&B, Covid) Nasopharyngeal Swab     Status: None   Collection Time: 11/25/20  7:50 PM   Specimen: Nasopharyngeal Swab; Nasopharyngeal(NP) swabs in vial transport medium  Result Value Ref Range Status   SARS Coronavirus 2 by RT PCR NEGATIVE NEGATIVE Final    Comment: (NOTE) SARS-CoV-2 target nucleic acids are NOT DETECTED.  The SARS-CoV-2 RNA is generally detectable in upper respiratory specimens during the acute phase of infection. The lowest concentration of SARS-CoV-2 viral copies this assay can detect is 138 copies/mL. A negative result does not preclude SARS-Cov-2 infection and should not be used as the sole basis for treatment or other patient management decisions. A negative result may occur with  improper specimen collection/handling, submission of specimen other than nasopharyngeal swab, presence of viral mutation(s) within the areas targeted by this assay, and inadequate number of viral copies(<138 copies/mL). A negative result must be combined with clinical observations, patient history, and epidemiological information. The expected result is Negative.  Fact Sheet for Patients:  BloggerCourse.com  Fact Sheet for Healthcare Providers:  SeriousBroker.it  This test is no t yet approved or cleared by the [place] FDA and  has been authorized for detection and/or diagnosis of SARS-CoV-2 by FDA under an Emergency Use  Authorization (EUA). This EUA will remain  in effect (meaning this test can be used) for the duration of the COVID-19 declaration under Section 564(b)(1) of the Act, 21 U.S.C.section 360bbb-3(b)(1), unless  the authorization is terminated  or revoked sooner.       Influenza A by PCR NEGATIVE NEGATIVE Final   Influenza B by PCR NEGATIVE NEGATIVE Final    Comment: (NOTE) The Xpert Xpress SARS-CoV-2/FLU/RSV plus assay is intended as an aid in the diagnosis of influenza from Nasopharyngeal swab specimens and should not be used as a sole basis for treatment. Nasal washings and aspirates are unacceptable for Xpert Xpress SARS-CoV-2/FLU/RSV testing.  Fact Sheet for Patients: EntrepreneurPulse.com.au  Fact Sheet for Healthcare Providers: IncredibleEmployment.be  This test is not yet approved or cleared by the [place] FDA and has been authorized for detection and/or diagnosis of SARS-CoV-2 by FDA under an Emergency Use Authorization (EUA). This EUA will remain in effect (meaning this test can be used) for the duration of the COVID-19 declaration under Section 564(b)(1) of the Act, 21 U.S.C. section 360bbb-3(b)(1), unless the authorization is terminated or revoked.  Performed at [facility] [address] [place], [place] [address]     RADIOLOGY:  DG OR UROLOGY CYSTO IMAGE ([place])  Result Date: 11/25/2020 There is no interpretation for this exam.  This order is for images obtained during a surgical procedure.  Please See "Surgeries" Tab for more information regarding the procedure.     Management plans discussed with the patient, and she is in agreement.  CODE STATUS:     Code Status Orders  (From admission, onward)           Start     Ordered   11/25/20 2029  Full code  Continuous        11/25/20 2031           Code Status History     This patient has a current code status but no historical code status.        TOTAL TIME TAKING CARE OF THIS PATIENT: 34 minutes.    Loletha Grayer M.D on 11/27/2020 at 5:53 PM   [facility]  CC: Primary care physician; [facility]

## 2020-11-27 NOTE — Progress Notes (Signed)
Patient discharged to home accompanied by husband.  Patient discharged with all pertinent information, prescriptions, and personal belongings.  Patient able to teach back all discharge instructions, all questions answered.  Patient discharged with glucometer, patient able to teach back Diabetes management.  IV site d/ced.  No acute distress noted.  Care relinquished.

## 2020-12-04 ENCOUNTER — Ambulatory Visit: Payer: Self-pay | Admitting: Urology

## 2020-12-12 ENCOUNTER — Ambulatory Visit (INDEPENDENT_AMBULATORY_CARE_PROVIDER_SITE_OTHER): Payer: Self-pay | Admitting: Urology

## 2020-12-12 ENCOUNTER — Other Ambulatory Visit: Payer: Self-pay

## 2020-12-12 ENCOUNTER — Encounter: Payer: Self-pay | Admitting: Urology

## 2020-12-12 VITALS — BP 111/72 | HR 94 | Ht 67.0 in | Wt 147.0 lb

## 2020-12-12 DIAGNOSIS — N1330 Unspecified hydronephrosis: Secondary | ICD-10-CM

## 2020-12-12 LAB — MICROSCOPIC EXAMINATION: Epithelial Cells (non renal): >10 /hpf — ABNORMAL HIGH (ref 0–10)

## 2020-12-12 LAB — URINALYSIS, COMPLETE
Bilirubin, UA: NEGATIVE
Nitrite, UA: NEGATIVE
Specific Gravity, UA: 1.01 (ref 1.005–1.030)
Urobilinogen, Ur: 0.2 mg/dL (ref 0.2–1.0)
pH, UA: 6.5 (ref 5.0–7.5)

## 2020-12-12 MED ORDER — LEVOFLOXACIN 500 MG PO TABS
500.0000 mg | ORAL_TABLET | Freq: Once | ORAL | Status: AC
  Administered 2020-12-12: 500 mg via ORAL

## 2020-12-12 NOTE — Progress Notes (Signed)
Indications: Patient is 55 y.o., who was recently mated to [facility] with UTI and found to have right hydronephrosis/hydroureter to the UPJ.  CT showed no evidence of obstructing stone.  She underwent cystoscopy with right retrograde pyelogram and ureteral stent placement by Dr. Annabell Howells 11/25/2020.  Intraoperative findings remarkable for purulent urine effluxing from the right UO.  Right retrograde pyelogram with slightly irregular ureter without filling defect and mild dilation of the renal pelvis.  The patient is presenting today for stent removal.  Procedure:  Flexible Cystoscopy with stent removal (78588)  Timeout was performed and the correct patient, procedure and participants were identified.    Description:  The patient was prepped and draped in the usual sterile fashion. Flexible cystosopy was performed.  The stent was visualized, grasped, and removed intact without difficulty. The patient tolerated the procedure well.  A single dose of oral antibiotics was given.  Complications:  None  Plan:  We discussed the etiology of her right hydronephrosis is unknown and that she could potentially have recurrent pyelonephritis Instructed to call for fever or flank pain post stent removal Schedule 6-week follow-up with CT urogram prior

## 2021-01-15 ENCOUNTER — Ambulatory Visit: Payer: Self-pay

## 2021-01-25 ENCOUNTER — Ambulatory Visit: Payer: Self-pay | Admitting: Urology

## 2021-02-11 ENCOUNTER — Other Ambulatory Visit: Payer: Self-pay

## 2021-03-20 ENCOUNTER — Other Ambulatory Visit: Payer: Self-pay

## 2021-04-23 ENCOUNTER — Telehealth: Payer: Self-pay | Admitting: Pharmacist

## 2021-04-23 NOTE — Telephone Encounter (Signed)
Patient failed to provide requested 2022 financial documentation. No additional medication assistance will be provided by [facility] without the required proof of income documentation. Patient notified by letter. Debra Cheek Administrative Assistant [facility] 

## 2021-04-29 ENCOUNTER — Other Ambulatory Visit: Payer: Self-pay

## 2021-09-06 ENCOUNTER — Emergency Department
Admission: EM | Admit: 2021-09-06 | Discharge: 2021-09-07 | Disposition: A | Discharge status: Home / Self Care | Payer: Self-pay | Attending: Emergency Medicine | Admitting: Emergency Medicine

## 2021-09-06 ENCOUNTER — Other Ambulatory Visit: Payer: Self-pay

## 2021-09-06 DIAGNOSIS — R5381 Other malaise: Secondary | ICD-10-CM | POA: Insufficient documentation

## 2021-09-06 DIAGNOSIS — Z20822 Contact with and (suspected) exposure to covid-19: Secondary | ICD-10-CM | POA: Insufficient documentation

## 2021-09-06 DIAGNOSIS — R531 Weakness: Secondary | ICD-10-CM | POA: Insufficient documentation

## 2021-09-06 DIAGNOSIS — E1165 Type 2 diabetes mellitus with hyperglycemia: Secondary | ICD-10-CM | POA: Insufficient documentation

## 2021-09-06 DIAGNOSIS — R739 Hyperglycemia, unspecified: Secondary | ICD-10-CM

## 2021-09-06 DIAGNOSIS — Z794 Long term (current) use of insulin: Secondary | ICD-10-CM | POA: Insufficient documentation

## 2021-09-06 DIAGNOSIS — J209 Acute bronchitis, unspecified: Secondary | ICD-10-CM | POA: Insufficient documentation

## 2021-09-06 LAB — CBC WITH DIFFERENTIAL/PLATELET
Abs Immature Granulocytes: 0.12 10*3/uL — ABNORMAL HIGH (ref 0.00–0.07)
Basophils Absolute: 0.1 10*3/uL (ref 0.0–0.1)
Basophils Relative: 0 %
Eosinophils Absolute: 0.2 10*3/uL (ref 0.0–0.5)
Eosinophils Relative: 1 %
HCT: 42.2 % (ref 36.0–46.0)
Hemoglobin: 13.8 g/dL (ref 12.0–15.0)
Immature Granulocytes: 1 %
Lymphocytes Relative: 28 %
Lymphs Abs: 4.9 10*3/uL — ABNORMAL HIGH (ref 0.7–4.0)
MCH: 30.5 pg (ref 26.0–34.0)
MCHC: 32.7 g/dL (ref 30.0–36.0)
MCV: 93.2 fL (ref 80.0–100.0)
Monocytes Absolute: 1.4 10*3/uL — ABNORMAL HIGH (ref 0.1–1.0)
Monocytes Relative: 8 %
Neutro Abs: 10.7 10*3/uL — ABNORMAL HIGH (ref 1.7–7.7)
Neutrophils Relative %: 62 %
Platelets: 424 10*3/uL — ABNORMAL HIGH (ref 150–400)
RBC: 4.53 MIL/uL (ref 3.87–5.11)
RDW: 13.4 % (ref 11.5–15.5)
WBC: 17.3 10*3/uL — ABNORMAL HIGH (ref 4.0–10.5)
nRBC: 0 % (ref 0.0–0.2)

## 2021-09-06 LAB — BETA-HYDROXYBUTYRIC ACID: Beta-Hydroxybutyric Acid: 0.08 mmol/L (ref 0.05–0.27)

## 2021-09-06 LAB — COMPREHENSIVE METABOLIC PANEL
ALT: 29 U/L (ref 0–44)
AST: 26 U/L (ref 15–41)
Albumin: 3.8 g/dL (ref 3.5–5.0)
Alkaline Phosphatase: 199 U/L — ABNORMAL HIGH (ref 38–126)
Anion gap: 13 (ref 5–15)
BUN: 5 mg/dL — ABNORMAL LOW (ref 6–20)
CO2: 30 mmol/L (ref 22–32)
Calcium: 9.1 mg/dL (ref 8.9–10.3)
Chloride: 89 mmol/L — ABNORMAL LOW (ref 98–111)
Creatinine, Ser: 0.88 mg/dL (ref 0.44–1.00)
GFR, Estimated: >60 mL/min (ref >60)
Glucose, Bld: 487 mg/dL — ABNORMAL HIGH (ref 70–99)
Potassium: 3.1 mmol/L — ABNORMAL LOW (ref 3.5–5.1)
Sodium: 132 mmol/L — ABNORMAL LOW (ref 135–145)
Total Bilirubin: 0.4 mg/dL (ref 0.3–1.2)
Total Protein: 7.3 g/dL (ref 6.5–8.1)

## 2021-09-06 MED ORDER — SODIUM CHLORIDE 0.9 % IV BOLUS
1000.0000 mL | Freq: Once | INTRAVENOUS | Status: AC
  Administered 2021-09-07: 1000 mL via INTRAVENOUS

## 2021-09-06 NOTE — ED Triage Notes (Signed)
Pt states she is here for hyperglycemia. Pt states this morning was over 300 and tonight is over 500. Pt denies nausea, vomiting, is alert and oriented.

## 2021-09-06 NOTE — ED Provider Triage Note (Signed)
Emergency Medicine Provider Triage Evaluation Note  Tina Cisneros , a 56 y.o. female  was evaluated in triage.  Pt complains of concern for hyperglycemia.  Patient has had dizziness and headache.  She states that she evaluated her glucose earlier this evening and the reading was "high".  She denies chest pain, chest tightness or shortness of breath.  Review of Systems  Positive: Patient has dizziness and headache. Negative: No chest pain or abdominal pain.  Physical Exam  BP (!) 149/97   Pulse 99   Temp 98.3 F (36.8 C)   Resp 18   Ht 5\' 5"  (1.651 m)   Wt 54.4 kg   LMP 12/21/2014 (Approximate)   SpO2 100%   BMI 19.97 kg/m  Gen:   Awake, no distress   Resp:  Normal effort  MSK:   Moves extremities without difficulty  Other:    Medical Decision Making  Medically screening exam initiated at 10:25 PM.  Appropriate orders placed.  Leonie Amacher was informed that the remainder of the evaluation will be completed by another provider, this initial triage assessment does not replace that evaluation, and the importance of remaining in the ED until their evaluation is complete.     Hadley Pen Advance, [place] 09/06/21 2226

## 2021-09-07 ENCOUNTER — Emergency Department: Payer: Self-pay

## 2021-09-07 LAB — URINALYSIS, ROUTINE W REFLEX MICROSCOPIC
Bilirubin Urine: NEGATIVE
Glucose, UA: >=500 mg/dL — AB
Hgb urine dipstick: NEGATIVE
Ketones, ur: NEGATIVE mg/dL
Leukocytes,Ua: NEGATIVE
Nitrite: NEGATIVE
Protein, ur: NEGATIVE mg/dL
Specific Gravity, Urine: 1.007 (ref 1.005–1.030)
pH: 5 (ref 5.0–8.0)

## 2021-09-07 LAB — CBG MONITORING, ED: Glucose-Capillary: 350 mg/dL — ABNORMAL HIGH (ref 70–99)

## 2021-09-07 LAB — SARS CORONAVIRUS 2 BY RT PCR: SARS Coronavirus 2 by RT PCR: NEGATIVE

## 2021-09-07 MED ORDER — AZITHROMYCIN 500 MG PO TABS
500.0000 mg | ORAL_TABLET | Freq: Once | ORAL | Status: AC
  Administered 2021-09-07: 500 mg via ORAL
  Filled 2021-09-07: qty 1

## 2021-09-07 MED ORDER — AZITHROMYCIN 250 MG PO TABS: 250.0000 mg | ORAL_TABLET | Freq: Every day | ORAL | 0 refills | Status: AC

## 2021-09-07 NOTE — ED Provider Notes (Signed)
[facility] Provider Note    Event Date/Time   First MD Initiated Contact with Patient 09/07/21 [phone]     (approximate)   History   Hyperglycemia   HPI  Tina Cisneros is a 56 y.o. female who presents to the ED from home with a chief complaint of hyperglycemia.  Patient is an insulin-dependent diabetic with baseline blood sugars in the 200 range.  Tonight she felt bad with generalized malaise and noted her blood sugar over 500.  States she is compliant with her medications and even took an extra dose of insulin tonight.  Endorses recent cough and generalized weakness.  Denies fever, chest pain, shortness of breath, abdominal pain, nausea, vomiting, dysuria or diarrhea.     Past Medical History   Past Medical History:  Diagnosis Date   Depression      Active Problem List   Patient Active Problem List   Diagnosis Date Noted   Type 2 diabetes mellitus with hyperosmolar nonketotic hyperglycemia (HCC)    Hydronephrosis of right kidney    Bipolar disorder ([facility])    Tardive dyskinesia    UTI (urinary tract infection) 11/25/2020     Past Surgical History   Past Surgical History:  Procedure Laterality Date   cesection     CYSTOSCOPY WITH STENT PLACEMENT Right 11/25/2020   Procedure: CYSTOSCOPY WITH STENT PLACEMENT;  Surgeon: Bjorn Pippin, MD;  Location: [facility] ORS;  Service: Urology;  Laterality: Right;     Home Medications   Prior to Admission medications   Medication Sig Start Date End Date Taking? Authorizing Provider  azithromycin (ZITHROMAX) 250 MG tablet Take 1 tablet (250 mg total) by mouth daily. 09/07/21  Yes Irean Hong, MD  atomoxetine (STRATTERA) 80 MG capsule Take 80 mg by mouth daily.    [provider]  donepezil (ARICEPT) 5 MG tablet Take 5 mg by mouth at bedtime.    [provider]  gabapentin (NEURONTIN) 300 MG capsule Take 300 mg by mouth 2 (two) times daily.    [provider]  hydrOXYzine (VISTARIL) 25  MG capsule Take 25 mg by mouth 3 (three) times daily as needed for anxiety.    [provider]  Insulin Glargine (BASAGLAR KWIKPEN) 100 UNIT/ML Inject 20 Units into the skin once daily. 11/28/20   Alford Highland, MD  insulin lispro (HUMALOG KWIKPEN) 100 UNIT/ML KwikPen Inject 4 Units into the skin 3 (three) times daily. 11/27/20   Alford Highland, MD  Insulin Pen Needle 32G X 4 MM MISC USE AS DIRECTED WITH INSULIN PENS. 11/27/20   Alford Highland, MD  lamoTRIgine (LAMICTAL) 200 MG tablet Take 200 mg by mouth daily.    [provider]  Lurasidone HCl 120 MG TABS Take 120 mg by mouth daily with breakfast.    [provider]  oxybutynin (DITROPAN) 5 MG tablet Take 1 tablet (5 mg total) by mouth once every 8 (eight) hours as needed for bladder spasms. 11/27/20   Alford Highland, MD  tamsulosin (FLOMAX) 0.4 MG CAPS capsule Take 1 capsule (0.4 mg total) by mouth once daily. 11/27/20   Alford Highland, MD  traZODone (DESYREL) 100 MG tablet Take 100 mg by mouth at bedtime.    [provider]  valbenazine (INGREZZA) 80 MG capsule Take 80 mg by mouth at bedtime.    [provider]     Allergies  Patient has no known allergies.   Family History   Family History  Problem Relation Age of Onset  Breast cancer Sister      Physical Exam  Triage Vital Signs: ED Triage Vitals [09/06/21 2223]  Enc Vitals Group     BP (!) 149/97     Pulse Rate 99     Resp 18     Temp 98.3 F (36.8 C)     Temp src      SpO2 100 %     Weight 120 lb (54.4 kg)     Height 5\' 5"  (1.651 m)     Head Circumference      Peak Flow      Pain Score 0     Pain Loc      Pain Edu?      Excl. in GC?     Updated Vital Signs: BP 137/81   Pulse (!) 105   Temp 98.1 F (36.7 C) (Oral)   Resp 20   Ht 5\' 5"  (1.651 m)   Wt 54.4 kg   LMP 12/21/2014 (Approximate)   SpO2 95%   BMI 19.97 kg/m    General: Awake, no distress.  CV:  RRR.  Good peripheral perfusion.   Resp:  Normal effort.  CTA B. Abd:  Nontender.  No distention.  Other:  Mildly dry mucous membranes.   ED Results / Procedures / Treatments  Labs (all labs ordered are listed, but only abnormal results are displayed) Labs Reviewed  CBC WITH DIFFERENTIAL/PLATELET - Abnormal; Notable for the following components:      Result Value   WBC 17.3 (*)    Platelets 424 (*)    Neutro Abs 10.7 (*)    Lymphs Abs 4.9 (*)    Monocytes Absolute 1.4 (*)    Abs Immature Granulocytes 0.12 (*)    All other components within normal limits  COMPREHENSIVE METABOLIC PANEL - Abnormal; Notable for the following components:   Sodium 132 (*)    Potassium 3.1 (*)    Chloride 89 (*)    Glucose, Bld 487 (*)    BUN 5 (*)    Alkaline Phosphatase 199 (*)    All other components within normal limits  URINALYSIS, ROUTINE W REFLEX MICROSCOPIC - Abnormal; Notable for the following components:   Color, Urine STRAW (*)    APPearance CLEAR (*)    Glucose, UA >=500 (*)    Bacteria, UA RARE (*)    All other components within normal limits  CBG MONITORING, ED - Abnormal; Notable for the following components:   Glucose-Capillary 350 (*)    All other components within normal limits  SARS CORONAVIRUS 2 BY RT PCR  BETA-HYDROXYBUTYRIC ACID     EKG  ED ECG REPORT I, Pebble Botkin J, the attending physician, personally viewed and interpreted this ECG.   Date: 09/07/2021  EKG Time: 0214  Rate: 102  Rhythm: sinus tachycardia  Axis: Normal  Intervals:none  ST&T Change: Nonspecific    RADIOLOGY I have independently visualized and interpreted patient's chest x-ray as well as noted the radiology interpretation:  Chest x-ray: Chronic interstitial lung disease  Official radiology report(s): DG Chest 2 View  Result Date: 09/07/2021 CLINICAL DATA:  56 year old female with cough, dizziness, weakness, hyperglycemia. EXAM: CHEST - 2 VIEW COMPARISON:  Chest radiographs 03/04/2018 and earlier. CT Abdomen and Pelvis  11/25/2020 FINDINGS: Seated AP and lateral views at 0308 hours. Widespread increased pulmonary interstitial markings appear similar to 2020 and somewhat asymmetrically greater on the left. CT Abdomen and Pelvis last year with evidence of bilateral lung base scarring. Lung volumes and mediastinal contours  remain normal. Visualized tracheal air column is within normal limits. No pneumothorax, pleural effusion or confluent pulmonary opacity. No acute osseous abnormality identified. Negative visible bowel gas. IMPRESSION: Evidence of chronic interstitial lung scarring since 2020. No acute cardiopulmonary abnormality identified. Electronically Signed   By: Odessa Fleming M.D.   On: 09/07/2021 04:07     PROCEDURES:  Critical Care performed: No  .1-3 Lead EKG Interpretation  Performed by: Irean Hong, MD Authorized by: Irean Hong, MD     Interpretation: abnormal     ECG rate:  105   ECG rate assessment: tachycardic     Rhythm: sinus tachycardia     Ectopy: none     Conduction: normal   Comments:     Patient placed on cardiac monitor to evaluate for arrhythmias    MEDICATIONS ORDERED IN ED: Medications  azithromycin (ZITHROMAX) tablet 500 mg (has no administration in time range)  sodium chloride 0.9 % bolus 1,000 mL (0 mLs Intravenous Stopped 09/07/21 0153)     IMPRESSION / MDM / ASSESSMENT AND PLAN / ED COURSE  I reviewed the triage vital signs and the nursing notes.                             56 year old female presenting with hyperglycemia and generalized weakness.  Differential diagnosis includes but is not limited to medication compliance, metabolic, infectious etiology, etc.  I have personally reviewed patient's records and note her pain medicine office clinic visit for cervical radiculopathy on 08/28/2021.  Patient's presentation is most consistent with acute presentation with potential threat to life or bodily function.  The patient is on the cardiac monitor to evaluate for evidence of  arrhythmia and/or significant heart rate changes.  Laboratory results demonstrate leukocytosis WBC 17.3, mild hypokalemia potassium 3.1, mild hyponatremia sodium 132, hyperglycemia without elevation of anion gap.  Beta hydroxybutyrate uric acid is normal.  Will obtain UA, chest x-ray, COVID swab and reassess.  Patient had 1 L normal saline; will recheck  Clinical Course as of 09/07/21 0415  Sat Sep 07, 2021  0402 Patient desires to leave before x-ray interpretation.  Given possible infiltrate on chest x-ray coupled with patient's cough, leukocytosis and hyperglycemia, will place her on Z-Pak.  Strict return precautions given.  Patient and daughter verbalized understanding agree with plan of care. [JS]  0415 Official chest x-ray read chronic interstitial disease.  We will proceed with plan for azithromycin. [JS]    Clinical Course User Index [JS] Irean Hong, MD     FINAL CLINICAL IMPRESSION(S) / ED DIAGNOSES   Final diagnoses:  Hyperglycemia  Acute bronchitis, unspecified organism     Rx / DC Orders   ED Discharge Orders          Ordered    azithromycin (ZITHROMAX) 250 MG tablet  Daily        09/07/21 0407             Note:  This document was prepared using Dragon voice recognition software and may include unintentional dictation errors.   Irean Hong, MD 09/07/21 [phone]

## 2021-09-07 NOTE — ED Notes (Signed)
Patient ambulated with continuous pulse-ox. She stayed between 93-94%

## 2021-09-07 NOTE — Discharge Instructions (Addendum)
Finish antibiotic as prescribed.  Drink plenty of fluids daily.  Return to the ER for worsening symptoms, persistent vomiting, difficulty breathing or other concerns.

## 2021-12-29 ENCOUNTER — Emergency Department
Admission: EM | Admit: 2021-12-29 | Discharge: 2021-12-29 | Discharge status: Against Medical Advice | Payer: Self-pay | Attending: Emergency Medicine | Admitting: Emergency Medicine

## 2021-12-29 ENCOUNTER — Emergency Department: Payer: Self-pay

## 2021-12-29 ENCOUNTER — Other Ambulatory Visit: Payer: Self-pay

## 2021-12-29 DIAGNOSIS — Z5321 Procedure and treatment not carried out due to patient leaving prior to being seen by health care provider: Secondary | ICD-10-CM | POA: Insufficient documentation

## 2021-12-29 DIAGNOSIS — R300 Dysuria: Secondary | ICD-10-CM | POA: Insufficient documentation

## 2021-12-29 DIAGNOSIS — R319 Hematuria, unspecified: Secondary | ICD-10-CM | POA: Insufficient documentation

## 2021-12-29 DIAGNOSIS — R109 Unspecified abdominal pain: Secondary | ICD-10-CM | POA: Insufficient documentation

## 2021-12-29 HISTORY — DX: Acute myocardial infarction, unspecified: I21.9

## 2021-12-29 HISTORY — DX: Type 2 diabetes mellitus without complications: E11.9

## 2021-12-29 LAB — URINALYSIS, ROUTINE W REFLEX MICROSCOPIC
Bilirubin Urine: NEGATIVE
Glucose, UA: >=500 mg/dL — AB
Ketones, ur: NEGATIVE mg/dL
Nitrite: NEGATIVE
Protein, ur: 100 mg/dL — AB
RBC / HPF: >50 RBC/hpf — ABNORMAL HIGH (ref 0–5)
Specific Gravity, Urine: 1.005 (ref 1.005–1.030)
WBC, UA: >50 WBC/hpf — ABNORMAL HIGH (ref 0–5)
pH: 6 (ref 5.0–8.0)

## 2021-12-29 LAB — COMPREHENSIVE METABOLIC PANEL
ALT: 50 U/L — ABNORMAL HIGH (ref 0–44)
AST: 30 U/L (ref 15–41)
Albumin: 4.2 g/dL (ref 3.5–5.0)
Alkaline Phosphatase: 133 U/L — ABNORMAL HIGH (ref 38–126)
Anion gap: 16 — ABNORMAL HIGH (ref 5–15)
BUN: 12 mg/dL (ref 6–20)
CO2: 22 mmol/L (ref 22–32)
Calcium: 9.3 mg/dL (ref 8.9–10.3)
Chloride: 102 mmol/L (ref 98–111)
Creatinine, Ser: 0.75 mg/dL (ref 0.44–1.00)
GFR, Estimated: >60 mL/min (ref >60)
Glucose, Bld: 212 mg/dL — ABNORMAL HIGH (ref 70–99)
Potassium: 3.7 mmol/L (ref 3.5–5.1)
Sodium: 140 mmol/L (ref 135–145)
Total Bilirubin: 0.8 mg/dL (ref 0.3–1.2)
Total Protein: 7.8 g/dL (ref 6.5–8.1)

## 2021-12-29 LAB — CBC WITH DIFFERENTIAL/PLATELET
Abs Immature Granulocytes: 0.09 10*3/uL — ABNORMAL HIGH (ref 0.00–0.07)
Basophils Absolute: 0.1 10*3/uL (ref 0.0–0.1)
Basophils Relative: 0 %
Eosinophils Absolute: 0.2 10*3/uL (ref 0.0–0.5)
Eosinophils Relative: 1 %
HCT: 45.4 % (ref 36.0–46.0)
Hemoglobin: 14 g/dL (ref 12.0–15.0)
Immature Granulocytes: 1 %
Lymphocytes Relative: 23 %
Lymphs Abs: 3.6 10*3/uL (ref 0.7–4.0)
MCH: 30.3 pg (ref 26.0–34.0)
MCHC: 30.8 g/dL (ref 30.0–36.0)
MCV: 98.3 fL (ref 80.0–100.0)
Monocytes Absolute: 1.8 10*3/uL — ABNORMAL HIGH (ref 0.1–1.0)
Monocytes Relative: 11 %
Neutro Abs: 9.8 10*3/uL — ABNORMAL HIGH (ref 1.7–7.7)
Neutrophils Relative %: 64 %
Platelets: 315 10*3/uL (ref 150–400)
RBC: 4.62 MIL/uL (ref 3.87–5.11)
RDW: 17.5 % — ABNORMAL HIGH (ref 11.5–15.5)
WBC: 15.5 10*3/uL — ABNORMAL HIGH (ref 4.0–10.5)
nRBC: 0 % (ref 0.0–0.2)

## 2021-12-29 LAB — LIPASE, BLOOD: Lipase: 105 U/L — ABNORMAL HIGH (ref 11–51)

## 2021-12-29 MED ORDER — FENTANYL CITRATE PF 50 MCG/ML IJ SOSY
50.0000 ug | PREFILLED_SYRINGE | Freq: Once | INTRAMUSCULAR | Status: AC
  Administered 2021-12-29: 50 ug via INTRAVENOUS
  Filled 2021-12-29: qty 1

## 2021-12-29 NOTE — ED Triage Notes (Signed)
Pt to er, pt states that for the past three to four days she has been having some blood in her urine and some abd pain, states that she has had a similar episode in the past where she was admitted for a UTI.  Pt states that the bleeding is not just when she wipes, it is in her urine.

## 2021-12-29 NOTE — ED Provider Triage Note (Signed)
  Emergency Medicine Provider Triage Evaluation Note  Tina Cisneros , a 56 y.o.female,  was evaluated in triage.  Pt complains of dysuria and right-sided flank pain.  Patient states that this been going on for 3 to 4 days.  Reports small amount of blood in her urine.  She states that she has been admitted in the past for urinary tract infections as cause similar bleeding.   Review of Systems  Positive: Dysuria, right-sided flank pain. Negative: Denies fever, chest pain, vomiting  Physical Exam   Vitals:   12/29/21 1901  BP: 127/78  Pulse: (!) 104  Resp: 18  Temp: 99.2 F (37.3 C)  SpO2: 100%   Gen:   Awake, no distress   Resp:  Normal effort  MSK:   Moves extremities without difficulty  Other:  Right-sided CVAT present.  Medical Decision Making  Given the patient's initial medical screening exam, the following diagnostic evaluation has been ordered. The patient will be placed in the appropriate treatment space, once one is available, to complete the evaluation and treatment. I have discussed the plan of care with the patient and I have advised the patient that an ED physician or mid-level practitioner will reevaluate their condition after the test results have been received, as the results may give them additional insight into the type of treatment they may need.    Diagnostics: Labs, UA, CT renal.  Treatments: Fentanyl   Varney Daily, [place] 12/29/21 1906

## 2023-02-28 ENCOUNTER — Emergency Department
Admission: EM | Admit: 2023-02-28 | Discharge: 2023-02-28 | Disposition: A | Discharge status: Home / Self Care | Payer: MEDICAID | Attending: Student in an Organized Health Care Education/Training Program | Admitting: Student in an Organized Health Care Education/Training Program

## 2023-02-28 ENCOUNTER — Emergency Department: Payer: MEDICAID

## 2023-02-28 ENCOUNTER — Other Ambulatory Visit: Payer: Self-pay

## 2023-02-28 DIAGNOSIS — E860 Dehydration: Secondary | ICD-10-CM | POA: Insufficient documentation

## 2023-02-28 DIAGNOSIS — R300 Dysuria: Secondary | ICD-10-CM | POA: Diagnosis not present

## 2023-02-28 DIAGNOSIS — F17200 Nicotine dependence, unspecified, uncomplicated: Secondary | ICD-10-CM | POA: Insufficient documentation

## 2023-02-28 DIAGNOSIS — R42 Dizziness and giddiness: Secondary | ICD-10-CM | POA: Diagnosis present

## 2023-02-28 HISTORY — DX: Heart failure, unspecified: I50.9

## 2023-02-28 LAB — CBC
HCT: 41.9 % (ref 36.0–46.0)
Hemoglobin: 13.2 g/dL (ref 12.0–15.0)
MCH: 30.9 pg (ref 26.0–34.0)
MCHC: 31.5 g/dL (ref 30.0–36.0)
MCV: 98.1 fL (ref 80.0–100.0)
Platelets: 311 10*3/uL (ref 150–400)
RBC: 4.27 MIL/uL (ref 3.87–5.11)
RDW: 13.6 % (ref 11.5–15.5)
WBC: 11.4 10*3/uL — ABNORMAL HIGH (ref 4.0–10.5)
nRBC: 0 % (ref 0.0–0.2)

## 2023-02-28 LAB — BASIC METABOLIC PANEL
Anion gap: 9 (ref 5–15)
BUN: 9 mg/dL (ref 6–20)
CO2: 27 mmol/L (ref 22–32)
Calcium: 9.6 mg/dL (ref 8.9–10.3)
Chloride: 105 mmol/L (ref 98–111)
Creatinine, Ser: 0.7 mg/dL (ref 0.44–1.00)
GFR, Estimated: >60 mL/min (ref >60)
Glucose, Bld: 99 mg/dL (ref 70–99)
Potassium: 3.9 mmol/L (ref 3.5–5.1)
Sodium: 141 mmol/L (ref 135–145)

## 2023-02-28 LAB — URINALYSIS, ROUTINE W REFLEX MICROSCOPIC
Bilirubin Urine: NEGATIVE
Glucose, UA: NEGATIVE mg/dL
Hgb urine dipstick: NEGATIVE
Ketones, ur: NEGATIVE mg/dL
Nitrite: NEGATIVE
Protein, ur: NEGATIVE mg/dL
Specific Gravity, Urine: 1.003 — ABNORMAL LOW (ref 1.005–1.030)
WBC, UA: >50 WBC/hpf (ref 0–5)
pH: 7 (ref 5.0–8.0)

## 2023-02-28 LAB — TROPONIN I (HIGH SENSITIVITY)
Troponin I (High Sensitivity): 7 ng/L (ref <18)
Troponin I (High Sensitivity): 6 ng/L (ref <18)

## 2023-02-28 LAB — CBG MONITORING, ED: Glucose-Capillary: 124 mg/dL — ABNORMAL HIGH (ref 70–99)

## 2023-02-28 MED ORDER — CEPHALEXIN 500 MG PO CAPS: 500.0000 mg | ORAL_CAPSULE | Freq: Two times a day (BID) | ORAL | 0 refills | Status: AC

## 2023-02-28 MED ORDER — ACETAMINOPHEN 500 MG PO TABS
1000.0000 mg | ORAL_TABLET | Freq: Once | ORAL | Status: AC
  Administered 2023-02-28: 1000 mg via ORAL
  Filled 2023-02-28: qty 2

## 2023-02-28 MED ORDER — IBUPROFEN 600 MG PO TABS
600.0000 mg | ORAL_TABLET | Freq: Once | ORAL | Status: DC
  Filled 2023-02-28: qty 1

## 2023-02-28 MED ORDER — SODIUM CHLORIDE 0.9 % IV BOLUS
1000.0000 mL | Freq: Once | INTRAVENOUS | Status: AC
  Administered 2023-02-28: 1000 mL via INTRAVENOUS

## 2023-02-28 NOTE — ED Triage Notes (Signed)
 Pt to ED for dizziness since 2 days, no chest pain or SOB. Dizziness worse when walking. States BG has been high last few days, was 400 last night. Pt also complains that she may have a bladder infection because urine is cloudy and smells bad. Current smoker since 45 years. First EKG shown to EDP, tracing not straight, 2nd EKG done and shown to EDP, normal. Pt has hx MI.

## 2023-02-28 NOTE — ED Provider Notes (Signed)
 [facility] Provider Note    Event Date/Time   First MD Initiated Contact with Patient 02/28/23 1012     (approximate)   History   Dizziness, Hyperglycemia, and UTI   HPI  Tina Cisneros is a 58 y.o. female presents the ER for several days of "dizziness "that she further describes as lightheadedness particularly with standing.  She denies any shortness of breath no chest pain.  States that she has had decreased p.o. intake over the past few days.  Denies any abdominal pain.  Has had some dysuria.  No diarrhea.  Does smoke but denies any coughing.  Denies any numbness or tingling.     Physical Exam   Triage Vital Signs: ED Triage Vitals  Encounter Vitals Group     BP 02/28/23 1000 125/75     Systolic BP Percentile --      Diastolic BP Percentile --      Pulse Rate 02/28/23 1000 (!) 109     Resp 02/28/23 1000 20     Temp 02/28/23 1000 (!) 97.1 F (36.2 C)     Temp Source 02/28/23 1000 Oral     SpO2 02/28/23 1000 100 %     Weight 02/28/23 1002 120 lb (54.4 kg)     Height 02/28/23 1002 5\' 6"  (1.676 m)     Head Circumference --      Peak Flow --      Pain Score 02/28/23 0956 0     Pain Loc --      Pain Education --      Exclude from Growth Chart --     Most recent vital signs: Vitals:   02/28/23 1230 02/28/23 1300  BP: 112/62 95/66  Pulse: 75 73  Resp: 19 16  Temp:    SpO2: 99% 100%     Constitutional: Alert  Eyes: Conjunctivae are normal.  Head: Atraumatic. Nose: No congestion/rhinnorhea. Mouth/Throat: Mucous membranes are moist.   Neck: Painless ROM.  Cardiovascular:   Good peripheral circulation. Respiratory: Normal respiratory effort.  No retractions.  Gastrointestinal: Soft and nontender.  Musculoskeletal:  no deformity Neurologic:  MAE spontaneously. No gross focal neurologic deficits are appreciated.  Skin:  Skin is warm, dry and intact. No rash noted. Psychiatric: Mood and affect are normal. Speech and behavior are  normal.    ED Results / Procedures / Treatments   Labs (all labs ordered are listed, but only abnormal results are displayed) Labs Reviewed  CBC - Abnormal; Notable for the following components:      Result Value   WBC 11.4 (*)    All other components within normal limits  URINALYSIS, ROUTINE W REFLEX MICROSCOPIC - Abnormal; Notable for the following components:   Color, Urine YELLOW (*)    APPearance CLOUDY (*)    Specific Gravity, Urine 1.003 (*)    Leukocytes,Ua LARGE (*)    Bacteria, UA RARE (*)    All other components within normal limits  CBG MONITORING, ED - Abnormal; Notable for the following components:   Glucose-Capillary 124 (*)    All other components within normal limits  BASIC METABOLIC PANEL  TROPONIN I (HIGH SENSITIVITY)  TROPONIN I (HIGH SENSITIVITY)     EKG  ED ECG REPORT I, Willy Eddy, the attending physician, personally viewed and interpreted this ECG.   Date: 02/28/2023  EKG Time: 10:00  Rate: 105  Rhythm: sinus  Axis: normal  Intervals: poor r wave progression  ST&T Change: no stemi, no depressions  RADIOLOGY Please see ED Course for my review and interpretation.  I personally reviewed all radiographic images ordered to evaluate for the above acute complaints and reviewed radiology reports and findings.  These findings were personally discussed with the patient.  Please see medical record for radiology report.    PROCEDURES:  Critical Care performed: No  Procedures   MEDICATIONS ORDERED IN ED: Medications  sodium chloride 0.9 % bolus 1,000 mL (0 mLs Intravenous Stopped 02/28/23 1319)  acetaminophen (TYLENOL) tablet 1,000 mg (1,000 mg Oral Given 02/28/23 1103)     IMPRESSION / MDM / ASSESSMENT AND PLAN / ED COURSE  I reviewed the triage vital signs and the nursing notes.                              Differential diagnosis includes, but is not limited to, Dehydration, sepsis, pna, uti, hypoglycemia, cva, drug effect,    Patient presenting to the ER for evaluation of symptoms as described above.  Based on symptoms, risk factors and considered above differential, this presenting complaint could reflect a potentially life-threatening illness therefore the patient will be placed on continuous pulse oximetry and telemetry for monitoring.  Laboratory evaluation will be sent to evaluate for the above complaints.  [place] dehydration, possible UTI?  Will order ct head.    Clinical Course as of 02/28/23 1345  Sat Feb 28, 2023  1340 Patient reassessed.  Feels significantly improved after IV hydration.  Was able to observe the patient ambulating she does not have any difficulty walking without assistance.  Discussed imaging workup and findings.  Will cover for possible UTI.  Discussed importance of increase p.o. intake. [PR]    Clinical Course User Index [PR] Willy Eddy, MD     FINAL CLINICAL IMPRESSION(S) / ED DIAGNOSES   Final diagnoses:  Dizziness  Dehydration     Rx / DC Orders   ED Discharge Orders          Ordered    cephALEXin (KEFLEX) 500 MG capsule  2 times daily        02/28/23 1344             Note:  This document was prepared using Dragon voice recognition software and may include unintentional dictation errors.    Willy Eddy, MD 02/28/23 1346
